# Patient Record
Sex: Female | Born: 1990 | Race: Asian | Hispanic: No | Smoking: Never smoker
Health system: Southern US, Community
[De-identification: ages and names within clinical notes are randomized; demographics above are authoritative.]

## PROBLEM LIST (undated history)

## (undated) DIAGNOSIS — Z789 Other specified health status: Secondary | ICD-10-CM

## (undated) HISTORY — DX: Other specified health status: Z78.9

---

## 2009-04-29 ENCOUNTER — Emergency Department (HOSPITAL_COMMUNITY): Admission: EM | Admit: 2009-04-29 | Discharge: 2009-04-29 | Payer: Self-pay | Admitting: Family Medicine

## 2009-05-04 ENCOUNTER — Ambulatory Visit: Payer: Self-pay | Admitting: Oncology

## 2010-07-17 IMAGING — CR DG ABDOMEN 1V
1 series · 1 of 1 positions shown · non-contrast
Comparison: None.

CLINICAL DATA: Abdominal pain

ABDOMEN - 1 VIEW

[view not recorded]
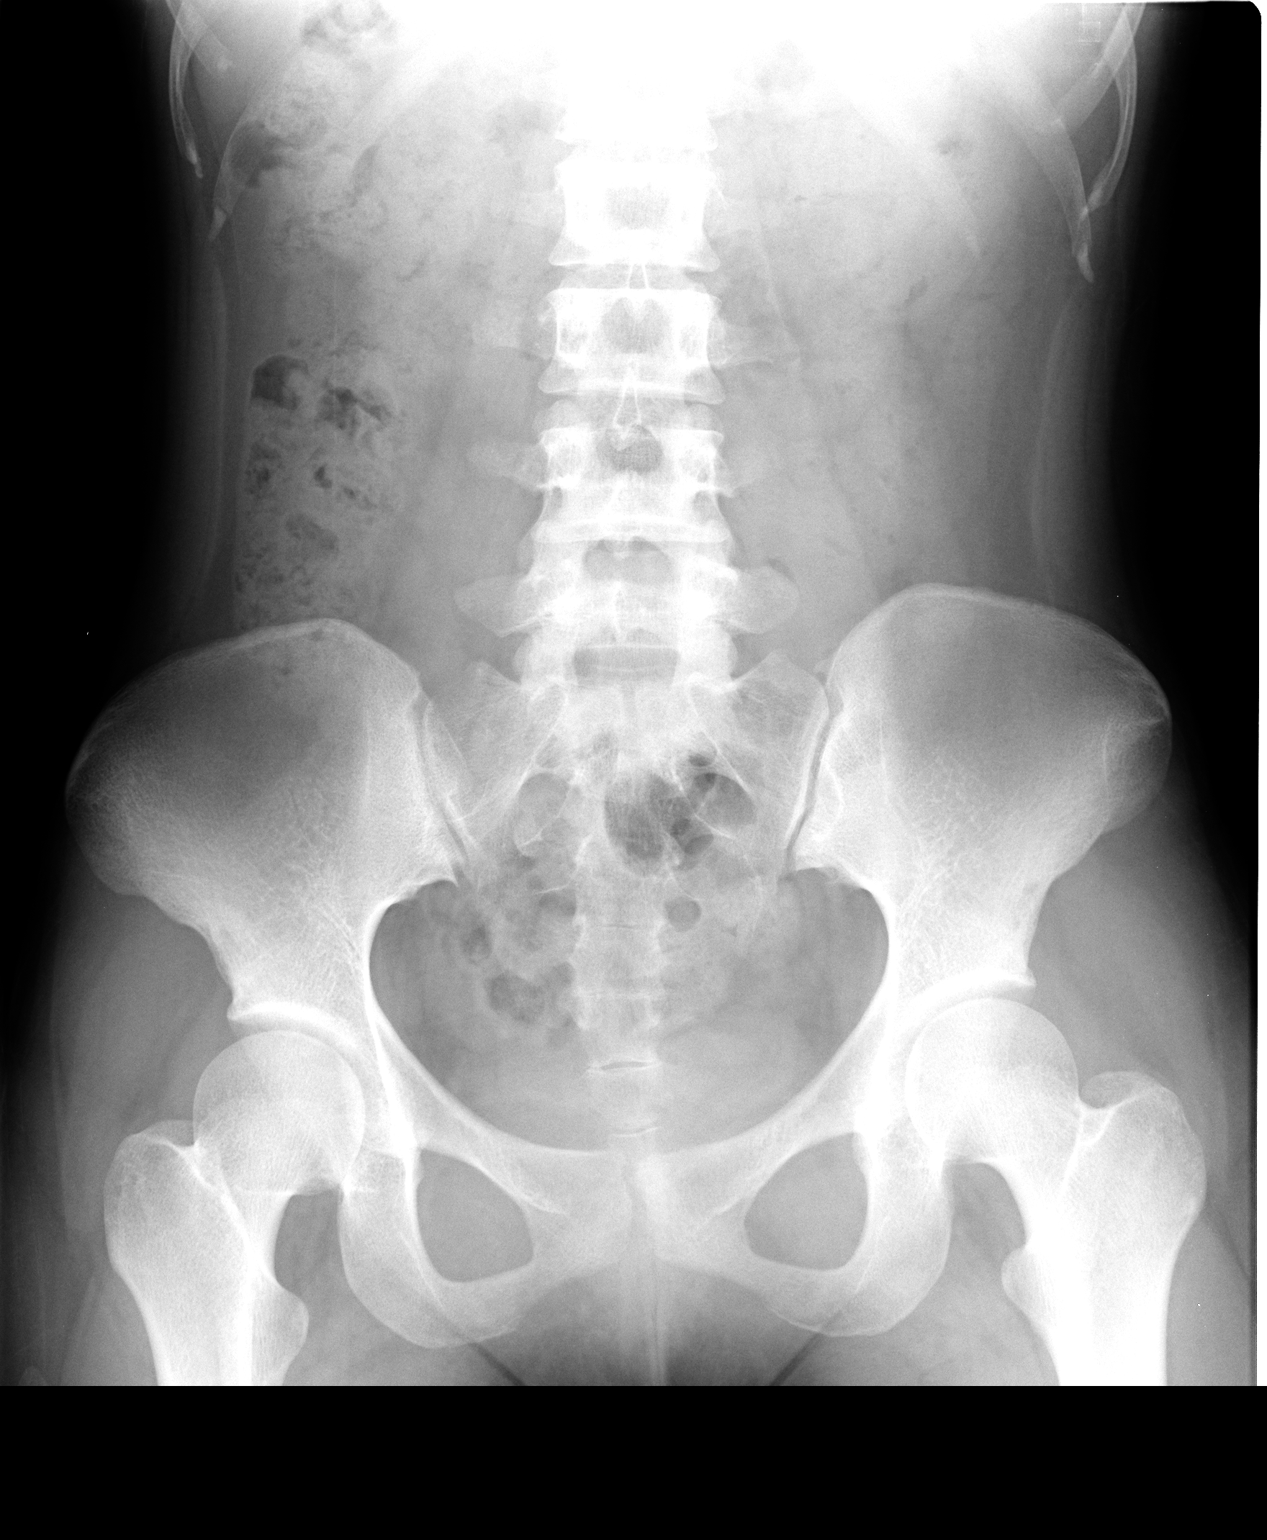

[1 of 1 positions shown; findings below may reference images not displayed]

FINDINGS: Stool is seen scattered along the length of the colon.
There is no gaseous bowel dilatation to suggest obstruction.  No
unexpected abdominopelvic calcification.  Visualized bones are
normal.
IMPRESSION: Normal exam.

## 2010-09-15 LAB — POCT I-STAT, CHEM 8
BUN: 15 mg/dL (ref 6–23)
Creatinine, Ser: 0.5 mg/dL (ref 0.4–1.2)
Potassium: 3.7 mEq/L (ref 3.5–5.1)
Sodium: 141 mEq/L (ref 135–145)

## 2010-09-15 LAB — POCT H PYLORI SCREEN: H. PYLORI SCREEN, POC: NEGATIVE

## 2010-09-15 LAB — CBC
Platelets: 496 10*3/uL — ABNORMAL HIGH (ref 150–400)
RBC: 5.18 MIL/uL — ABNORMAL HIGH (ref 3.87–5.11)
WBC: 8.5 10*3/uL (ref 4.0–10.5)

## 2010-09-15 LAB — DIFFERENTIAL
Basophils Absolute: 0 10*3/uL (ref 0.0–0.1)
Eosinophils Relative: 5 % (ref 0–5)
Lymphocytes Relative: 31 % (ref 12–46)
Monocytes Relative: 8 % (ref 3–12)
Neutrophils Relative %: 56 % (ref 43–77)

## 2010-09-15 LAB — POCT URINALYSIS DIP (DEVICE)
Bilirubin Urine: NEGATIVE
Glucose, UA: NEGATIVE mg/dL
Ketones, ur: NEGATIVE mg/dL
Nitrite: NEGATIVE
pH: 6.5 (ref 5.0–8.0)

## 2010-09-15 LAB — POCT PREGNANCY, URINE: Preg Test, Ur: NEGATIVE

## 2016-02-23 ENCOUNTER — Ambulatory Visit: Payer: Self-pay

## 2016-02-23 ENCOUNTER — Encounter (HOSPITAL_COMMUNITY): Payer: Self-pay | Admitting: Emergency Medicine

## 2016-02-23 ENCOUNTER — Emergency Department (HOSPITAL_COMMUNITY)
Admission: EM | Admit: 2016-02-23 | Discharge: 2016-02-23 | Disposition: A | Discharge status: Home / Self Care | Payer: Medicaid Other | Attending: Emergency Medicine | Admitting: Emergency Medicine

## 2016-02-23 DIAGNOSIS — M545 Low back pain, unspecified: Secondary | ICD-10-CM

## 2016-02-23 DIAGNOSIS — Y999 Unspecified external cause status: Secondary | ICD-10-CM | POA: Insufficient documentation

## 2016-02-23 DIAGNOSIS — M62830 Muscle spasm of back: Secondary | ICD-10-CM | POA: Diagnosis not present

## 2016-02-23 DIAGNOSIS — Y939 Activity, unspecified: Secondary | ICD-10-CM | POA: Insufficient documentation

## 2016-02-23 DIAGNOSIS — Y92410 Unspecified street and highway as the place of occurrence of the external cause: Secondary | ICD-10-CM | POA: Insufficient documentation

## 2016-02-23 MED ORDER — CYCLOBENZAPRINE HCL 10 MG PO TABS: 10.0000 mg | ORAL_TABLET | Freq: Three times a day (TID) | ORAL | 0 refills | Status: DC | PRN

## 2016-02-23 MED ORDER — NAPROXEN 500 MG PO TABS: 500.0000 mg | ORAL_TABLET | Freq: Two times a day (BID) | ORAL | 0 refills | Status: DC | PRN
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
## 2016-02-23 NOTE — Discharge Instructions (Signed)
Take naprosyn as directed for inflammation and pain with tylenol for breakthrough pain and flexeril for muscle relaxation. Do not drive or operate machinery with muscle relaxant use. Ice to areas of soreness for the next 24 hours and then may move to heat, no more than 20 minutes at a time every hour for each. Expect to be sore for the next few days and follow up with [facility] for recheck of ongoing symptoms and establish medical care in the next 1-2 weeks. Return to ER for emergent changing or worsening of symptoms.
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
## 2018-12-13 ENCOUNTER — Other Ambulatory Visit: Payer: Self-pay | Admitting: Internal Medicine

## 2018-12-13 ENCOUNTER — Other Ambulatory Visit: Payer: Self-pay

## 2018-12-13 DIAGNOSIS — Z20822 Contact with and (suspected) exposure to covid-19: Secondary | ICD-10-CM

## 2018-12-13 DIAGNOSIS — Z20828 Contact with and (suspected) exposure to other viral communicable diseases: Secondary | ICD-10-CM

## 2018-12-20 LAB — NOVEL CORONAVIRUS, NAA: SARS-CoV-2, NAA: NOT DETECTED

## 2019-01-28 ENCOUNTER — Other Ambulatory Visit: Payer: Self-pay

## 2019-01-28 DIAGNOSIS — Z20822 Contact with and (suspected) exposure to covid-19: Secondary | ICD-10-CM

## 2019-01-30 LAB — NOVEL CORONAVIRUS, NAA: SARS-CoV-2, NAA: NOT DETECTED

## 2022-08-26 ENCOUNTER — Other Ambulatory Visit: Payer: Self-pay | Admitting: Obstetrics and Gynecology

## 2022-08-26 ENCOUNTER — Ambulatory Visit
Admission: RE | Admit: 2022-08-26 | Discharge: 2022-08-26 | Disposition: A | Discharge status: Home / Self Care | Payer: No Typology Code available for payment source | Source: Ambulatory Visit | Attending: Obstetrics and Gynecology | Admitting: Obstetrics and Gynecology

## 2022-08-26 DIAGNOSIS — Z111 Encounter for screening for respiratory tuberculosis: Secondary | ICD-10-CM

## 2023-02-14 ENCOUNTER — Other Ambulatory Visit (HOSPITAL_COMMUNITY)
Admission: RE | Admit: 2023-02-14 | Discharge: 2023-02-14 | Disposition: A | Discharge status: Home / Self Care | Payer: Medicaid Other | Source: Ambulatory Visit | Attending: Family Medicine | Admitting: Family Medicine

## 2023-02-14 ENCOUNTER — Ambulatory Visit (INDEPENDENT_AMBULATORY_CARE_PROVIDER_SITE_OTHER): Payer: Medicaid Other

## 2023-02-14 ENCOUNTER — Other Ambulatory Visit: Payer: Self-pay

## 2023-02-14 VITALS — BP 130/84 | HR 88 | Ht 60.5 in | Wt 139.0 lb

## 2023-02-14 DIAGNOSIS — Z98891 History of uterine scar from previous surgery: Secondary | ICD-10-CM | POA: Insufficient documentation

## 2023-02-14 DIAGNOSIS — R8271 Bacteriuria: Secondary | ICD-10-CM

## 2023-02-14 DIAGNOSIS — Z3490 Encounter for supervision of normal pregnancy, unspecified, unspecified trimester: Secondary | ICD-10-CM | POA: Diagnosis present

## 2023-02-14 MED ORDER — BLOOD PRESSURE KIT DEVI: 1.0000 | Freq: Once | 0 refills | Status: AC

## 2023-02-14 MED ORDER — PRENATAL PLUS VITAMIN/MINERAL 27-1 MG PO TABS: 1.0000 | ORAL_TABLET | Freq: Every day | ORAL | 11 refills | Status: DC

## 2023-02-14 NOTE — Progress Notes (Addendum)
New OB Intake  Christine Hardy here for New OB Intake today. We discussed EDD of 05/30/2023, by unsure LMP. Pt is [name]. I reviewed her allergies, medications and Medical/Surgical/OB history.    Patient Active Problem List   Diagnosis Date Noted   Supervision of low-risk pregnancy 02/14/2023   History of C-section 02/14/2023   Concerns addressed today Prenatal vitamins sent to preferred pharmacy.  Delivery Plans Plans to deliver at [facility]. Patient reports history of c-section in 2016; pt was told this was due to baby being "face up." C-section done in [facility]. Will have patient sign ROI next visit for op note. Unable to locate record in Care Everywhere. Pt expresses she is hoping to have a vaginal birth with this pregnancy.  MyChart/Babyscripts MyChart access verified. Babyscripts instructions given and order placed.   Blood Pressure Cuff/Weight Scale Blood pressure cuff ordered for patient to pick-up from [employer]. Explained after first prenatal appt pt will check weekly and document in Babyscripts. Patient is unsure about checking BP at home. Explained she doesn't have to decide today and can use Babyscripts for pregnancy information only if she chooses. Patient does have weight scale.   Anatomy US Explained first scheduled [place] will be around 19 weeks. Scheduled 02/22/23.  Is patient a CenteringPregnancy candidate?  Declined Declined due to Group setting   Is patient a Mom+Baby Combined Care candidate?  Not a candidate    Is patient a candidate for Babyscripts Optimization? No - unsure about checking BP at home  First visit review All prenatal labs drawn today. Pt declines carrier screening but desires NIPS; Panorama drawn today. Will need Pap at first visit with provider. Will be seen by Tinnie Gens, MD.  Last Pap No results found for: "DIAGPAP"  Marjo Bicker, RN 02/14/2023  9:14 AM

## 2023-02-15 LAB — CBC/D/PLT+RPR+RH+ABO+RUBIGG...
Antibody Screen: NEGATIVE
Basophils Absolute: 0.1 10*3/uL (ref 0.0–0.2)
Basos: 1 %
EOS (ABSOLUTE): 0.1 10*3/uL (ref 0.0–0.4)
Eos: 1 %
HCV Ab: NONREACTIVE
HIV Screen 4th Generation wRfx: NONREACTIVE
Hematocrit: 35.4 % (ref 34.0–46.6)
Hemoglobin: 11.6 g/dL (ref 11.1–15.9)
Hepatitis B Surface Ag: NEGATIVE
Immature Grans (Abs): 0.1 10*3/uL (ref 0.0–0.1)
Immature Granulocytes: 1 %
Lymphocytes Absolute: 2 10*3/uL (ref 0.7–3.1)
Lymphs: 19 %
MCH: 27.4 pg (ref 26.6–33.0)
MCHC: 32.8 g/dL (ref 31.5–35.7)
MCV: 84 fL (ref 79–97)
Monocytes Absolute: 0.8 10*3/uL (ref 0.1–0.9)
Monocytes: 7 %
Neutrophils Absolute: 7.7 10*3/uL — ABNORMAL HIGH (ref 1.4–7.0)
Neutrophils: 71 %
Platelets: 288 10*3/uL (ref 150–450)
RBC: 4.23 x10E6/uL (ref 3.77–5.28)
RDW: 13.1 % (ref 11.7–15.4)
RPR Ser Ql: NONREACTIVE
Rh Factor: POSITIVE
Rubella Antibodies, IGG: 5.03 {index} (ref >0.99)
WBC: 10.7 10*3/uL (ref 3.4–10.8)

## 2023-02-15 LAB — HCV INTERPRETATION

## 2023-02-15 LAB — HEMOGLOBIN A1C
Est. average glucose Bld gHb Est-mCnc: 97 mg/dL
Hgb A1c MFr Bld: 5 % (ref 4.8–5.6)

## 2023-02-16 LAB — GC/CHLAMYDIA PROBE AMP (~~LOC~~) NOT AT ARMC
Chlamydia: NEGATIVE
Comment: NEGATIVE
Comment: NORMAL
Neisseria Gonorrhea: NEGATIVE

## 2023-02-17 ENCOUNTER — Other Ambulatory Visit (HOSPITAL_COMMUNITY)
Admission: RE | Admit: 2023-02-17 | Discharge: 2023-02-17 | Disposition: A | Discharge status: Home / Self Care | Payer: Medicaid Other | Source: Ambulatory Visit | Attending: Family Medicine | Admitting: Family Medicine

## 2023-02-17 ENCOUNTER — Ambulatory Visit (INDEPENDENT_AMBULATORY_CARE_PROVIDER_SITE_OTHER): Payer: Medicaid Other | Admitting: Family Medicine

## 2023-02-17 ENCOUNTER — Other Ambulatory Visit: Payer: Self-pay

## 2023-02-17 VITALS — BP 131/86 | HR 94 | Wt 139.9 lb

## 2023-02-17 DIAGNOSIS — Z98891 History of uterine scar from previous surgery: Secondary | ICD-10-CM

## 2023-02-17 DIAGNOSIS — Z3A25 25 weeks gestation of pregnancy: Secondary | ICD-10-CM

## 2023-02-17 DIAGNOSIS — Z3492 Encounter for supervision of normal pregnancy, unspecified, second trimester: Secondary | ICD-10-CM

## 2023-02-17 NOTE — Progress Notes (Signed)
Subjective:   Christine Hardy is a 32 y.o. [name] at [redacted]w[redacted]d by unsure LMP being seen today for her first obstetrical visit.  Her obstetrical history is significant for  previous C-section . Patient does intend to breast feed. Pregnancy history fully reviewed.  Patient reports no complaints.  HISTORY: OB History  Gravida Para Term Preterm AB Living  2 1 1  0 0 1  SAB IAB Ectopic Multiple Live Births  0 0 0 0 1    # Outcome Date GA Lbr Len/2nd Weight Sex Type Anes PTL Lv  2 Current           1 Term 08/04/14 [redacted]w[redacted]d  6 lb 3 oz (2.807 kg) F CS-LTranv   LIV   No past medical history on file. Past Surgical History:  Procedure Laterality Date   CESAREAN SECTION     Family History  Problem Relation Age of Onset   Diabetes Mother    Hypertension Mother    Diabetes Father    Social History   Tobacco Use   Smoking status: Never   Smokeless tobacco: Never  Vaping Use   Vaping status: Never Used  Substance Use Topics   Alcohol use: Never   Drug use: Never   No Known Allergies Current Outpatient Medications on File Prior to Visit  Medication Sig Dispense Refill   Prenatal Vit-Fe Fumarate-FA (PRENATAL PLUS VITAMIN/MINERAL) 27-1 MG TABS Take 1 tablet by mouth daily. 30 tablet 11   No current facility-administered medications on file prior to visit.     Exam   Vitals:   02/17/23 0949  BP: 131/86  Pulse: 94  Weight: 139 lb 14.4 oz (63.5 kg)   Fetal Heart Rate (bpm): 150  Uterus:  Fundal Height: 23 cm  Pelvic Exam: Perineum: no hemorrhoids, normal perineum   Vulva: normal external genitalia, no lesions   Vagina:  normal mucosa, normal discharge   Cervix: no lesions and normal, pap smear done.    Adnexa: normal adnexa and no mass, fullness, tenderness   Bony Pelvis: average  System: General: well-developed, well-nourished female in no acute distress   Breast:  normal appearance, no masses or tenderness   Skin: normal coloration and turgor, no rashes   Neurologic:  oriented, normal, negative, normal mood   Extremities: normal strength, tone, and muscle mass, ROM of all joints is normal   HEENT PERRLA, extraocular movement intact and sclera clear, anicteric   Mouth/Teeth mucous membranes moist, pharynx normal without lesions and dental hygiene good   Neck supple and no masses   Cardiovascular: regular rate and rhythm   Respiratory:  no respiratory distress, normal breath sounds   Abdomen: soft, non-tender; bowel sounds normal; no masses,  no organomegaly     Assessment:   Pregnancy: [name] Patient Active Problem List   Diagnosis Date Noted   Supervision of low-risk pregnancy 02/14/2023   History of C-section 02/14/2023     Plan:  1. History of C-section Due to OP presentation. Discussed F/B of VBAC vs. RCS--she will consider--needs consent after she makes a decision  2. Encounter for supervision of low-risk pregnancy in second trimester New OB labs are WNL She is unsure of her LMP, has been feeling fetal movement for several weeks.  Initial labs reviewed. Continue prenatal vitamins. Genetic Screening discussed, NIPS: ordered. Ultrasound discussed; fetal anatomic survey: ordered. Problem list reviewed and updated.  Routine obstetric precautions reviewed. Return in 3 weeks (on 03/10/2023) for 28 wk labs, [facility].

## 2023-02-17 NOTE — Addendum Note (Signed)
Addended by: Maxwell Marion E on: 02/17/2023 11:29 AM   Modules accepted: Orders

## 2023-02-18 LAB — CULTURE, OB URINE

## 2023-02-18 LAB — URINE CULTURE, OB REFLEX

## 2023-02-19 ENCOUNTER — Encounter: Payer: Self-pay | Admitting: Family Medicine

## 2023-02-19 DIAGNOSIS — R8271 Bacteriuria: Secondary | ICD-10-CM | POA: Insufficient documentation

## 2023-02-19 MED ORDER — CEFADROXIL 500 MG PO CAPS: 500.0000 mg | ORAL_CAPSULE | Freq: Two times a day (BID) | ORAL | 0 refills | Status: AC

## 2023-02-19 NOTE — Addendum Note (Signed)
Addended by: Reva Bores on: 02/19/2023 10:20 AM   Modules accepted: Orders

## 2023-02-21 LAB — CYTOLOGY - PAP
Comment: NEGATIVE
Diagnosis: NEGATIVE
High risk HPV: NEGATIVE

## 2023-02-22 ENCOUNTER — Other Ambulatory Visit: Payer: Medicaid Other

## 2023-02-22 ENCOUNTER — Ambulatory Visit: Payer: Medicaid Other

## 2023-02-22 LAB — PANORAMA PRENATAL TEST FULL PANEL:PANORAMA TEST PLUS 5 ADDITIONAL MICRODELETIONS: FETAL FRACTION: 7.4

## 2023-03-02 ENCOUNTER — Encounter: Payer: Self-pay | Admitting: *Deleted

## 2023-03-02 ENCOUNTER — Other Ambulatory Visit: Payer: Self-pay | Admitting: *Deleted

## 2023-03-02 ENCOUNTER — Ambulatory Visit: Payer: Medicaid Other | Attending: Family Medicine | Admitting: *Deleted

## 2023-03-02 ENCOUNTER — Ambulatory Visit (HOSPITAL_BASED_OUTPATIENT_CLINIC_OR_DEPARTMENT_OTHER): Payer: Medicaid Other

## 2023-03-02 VITALS — BP 138/76 | HR 101

## 2023-03-02 DIAGNOSIS — Z98891 History of uterine scar from previous surgery: Secondary | ICD-10-CM | POA: Diagnosis present

## 2023-03-02 DIAGNOSIS — Z363 Encounter for antenatal screening for malformations: Secondary | ICD-10-CM | POA: Insufficient documentation

## 2023-03-02 DIAGNOSIS — Z362 Encounter for other antenatal screening follow-up: Secondary | ICD-10-CM

## 2023-03-02 DIAGNOSIS — O34219 Maternal care for unspecified type scar from previous cesarean delivery: Secondary | ICD-10-CM | POA: Diagnosis not present

## 2023-03-02 DIAGNOSIS — O2692 Pregnancy related conditions, unspecified, second trimester: Secondary | ICD-10-CM | POA: Insufficient documentation

## 2023-03-02 DIAGNOSIS — Z3492 Encounter for supervision of normal pregnancy, unspecified, second trimester: Secondary | ICD-10-CM

## 2023-03-02 DIAGNOSIS — Z3A21 21 weeks gestation of pregnancy: Secondary | ICD-10-CM | POA: Diagnosis not present

## 2023-03-02 DIAGNOSIS — O358XX0 Maternal care for other (suspected) fetal abnormality and damage, not applicable or unspecified: Secondary | ICD-10-CM | POA: Insufficient documentation

## 2023-03-02 DIAGNOSIS — Z3490 Encounter for supervision of normal pregnancy, unspecified, unspecified trimester: Secondary | ICD-10-CM

## 2023-03-09 ENCOUNTER — Other Ambulatory Visit: Payer: Self-pay

## 2023-03-09 ENCOUNTER — Encounter: Payer: Self-pay | Admitting: Obstetrics and Gynecology

## 2023-03-09 ENCOUNTER — Ambulatory Visit (INDEPENDENT_AMBULATORY_CARE_PROVIDER_SITE_OTHER): Payer: Medicaid Other | Admitting: Obstetrics and Gynecology

## 2023-03-09 ENCOUNTER — Other Ambulatory Visit: Payer: Medicaid Other

## 2023-03-09 VITALS — BP 130/81 | HR 88 | Wt 142.8 lb

## 2023-03-09 DIAGNOSIS — O99891 Other specified diseases and conditions complicating pregnancy: Secondary | ICD-10-CM

## 2023-03-09 DIAGNOSIS — Z3A22 22 weeks gestation of pregnancy: Secondary | ICD-10-CM

## 2023-03-09 DIAGNOSIS — R8271 Bacteriuria: Secondary | ICD-10-CM

## 2023-03-09 DIAGNOSIS — Z3492 Encounter for supervision of normal pregnancy, unspecified, second trimester: Secondary | ICD-10-CM

## 2023-03-09 DIAGNOSIS — Z98891 History of uterine scar from previous surgery: Secondary | ICD-10-CM

## 2023-03-09 NOTE — Progress Notes (Signed)
Pt reports cramping in leg

## 2023-03-09 NOTE — Progress Notes (Signed)
Subjective:  Christine Hardy is a 32 y.o. [name] at [redacted]w[redacted]d being seen today for ongoing prenatal care.  She is currently monitored for the following issues for this low-risk pregnancy and has Supervision of low-risk pregnancy; History of C-section; and Asymptomatic bacteriuria during pregnancy on their problem list.  Patient reports  night leg cramps .  Contractions: Not present. Vag. Bleeding: None.  Movement: Present. Denies leaking of fluid.   The following portions of the patient's history were reviewed and updated as appropriate: allergies, current medications, past family history, past medical history, past social history, past surgical history and problem list. Problem list updated.  Objective:   Vitals:   03/09/23 0830  BP: 130/81  Pulse: 88  Weight: 142 lb 12.8 oz (64.8 kg)    Fetal Status: Fetal Heart Rate (bpm): 148   Movement: Present     General:  Alert, oriented and cooperative. Patient is in no acute distress.  Skin: Skin is warm and dry. No rash noted.   Cardiovascular: Normal heart rate noted  Respiratory: Normal respiratory effort, no problems with respiration noted  Abdomen: Soft, gravid, appropriate for gestational age. Pain/Pressure: Absent     Pelvic:  Cervical exam deferred        Extremities: Normal range of motion.  Edema: Trace  Mental Status: Normal mood and affect. Normal behavior. Normal judgment and thought content.   Urinalysis:      Assessment and Plan:  Pregnancy: [name] at [redacted]w[redacted]d  1. Encounter for supervision of low-risk pregnancy in second trimester Stable Declined Flu vaccine Glucola next visit, reviewed with pt  2. History of C-section VBAC consent signed today  3. Asymptomatic bacteriuria during pregnancy UC today  Preterm labor symptoms and general obstetric precautions including but not limited to vaginal bleeding, contractions, leaking of fluid and fetal movement were reviewed in detail with the patient. Please refer to After Visit Summary  for other counseling recommendations.  Return in about 4 weeks (around 04/06/2023) for OB visit, face to face, any provider, fasting for Glucola.   Hermina Staggers, MD

## 2023-03-30 ENCOUNTER — Ambulatory Visit: Payer: Medicaid Other | Attending: Obstetrics

## 2023-03-30 DIAGNOSIS — Z362 Encounter for other antenatal screening follow-up: Secondary | ICD-10-CM | POA: Diagnosis present

## 2023-03-30 DIAGNOSIS — Z3492 Encounter for supervision of normal pregnancy, unspecified, second trimester: Secondary | ICD-10-CM | POA: Insufficient documentation

## 2023-03-30 DIAGNOSIS — O99891 Other specified diseases and conditions complicating pregnancy: Secondary | ICD-10-CM | POA: Diagnosis not present

## 2023-03-30 DIAGNOSIS — Z3A25 25 weeks gestation of pregnancy: Secondary | ICD-10-CM | POA: Insufficient documentation

## 2023-03-30 DIAGNOSIS — O34219 Maternal care for unspecified type scar from previous cesarean delivery: Secondary | ICD-10-CM | POA: Diagnosis not present

## 2023-03-30 DIAGNOSIS — R8271 Bacteriuria: Secondary | ICD-10-CM | POA: Diagnosis not present

## 2023-04-05 ENCOUNTER — Other Ambulatory Visit: Payer: Self-pay

## 2023-04-05 DIAGNOSIS — Z3492 Encounter for supervision of normal pregnancy, unspecified, second trimester: Secondary | ICD-10-CM

## 2023-04-06 ENCOUNTER — Ambulatory Visit (INDEPENDENT_AMBULATORY_CARE_PROVIDER_SITE_OTHER): Payer: Medicaid Other | Admitting: Obstetrics and Gynecology

## 2023-04-06 ENCOUNTER — Other Ambulatory Visit: Payer: Self-pay

## 2023-04-06 ENCOUNTER — Other Ambulatory Visit: Payer: Medicaid Other

## 2023-04-06 VITALS — BP 138/82 | HR 108 | Wt 148.0 lb

## 2023-04-06 DIAGNOSIS — Z3492 Encounter for supervision of normal pregnancy, unspecified, second trimester: Secondary | ICD-10-CM

## 2023-04-06 DIAGNOSIS — Z3A26 26 weeks gestation of pregnancy: Secondary | ICD-10-CM

## 2023-04-06 NOTE — Progress Notes (Addendum)
Pt reports that she fell Monday morning, but didn't fall on stomach just hands & scraped knees.

## 2023-04-06 NOTE — Progress Notes (Signed)
   PRENATAL VISIT NOTE  Subjective:  Christine Hardy is a 32 y.o. [name] at [redacted]w[redacted]d being seen today for ongoing prenatal care.  She is currently monitored for the following issues for this low-risk pregnancy and has Supervision of low-risk pregnancy; History of C-section; and Asymptomatic bacteriuria during pregnancy on their problem list.  Patient reports no complaints. On Monday pt experienced a fall on her hands and knees but spared her abdomen. Pt did not go to the MAU after, for evaluation, but she endorses fetal movement and denies any cramping, vaginal bleeding, or pelvic pain. Contractions: Not present. Vag. Bleeding: None.  Movement: Present. Denies leaking of fluid.   The following portions of the patient's history were reviewed and updated as appropriate: allergies, current medications, past family history, past medical history, past social history, past surgical history and problem list.   Objective:   Vitals:   04/06/23 0842  BP: 138/82  Pulse: (!) 108  Weight: 148 lb (67.1 kg)    Fetal Status: Fetal Heart Rate (bpm): 156 Fundal Height: 26 cm Movement: Present     General:  Alert, oriented and cooperative. Patient is in no acute distress.  Skin: Skin is warm and dry. No rash noted.   Cardiovascular: Normal heart rate noted  Respiratory: Normal respiratory effort, no problems with respiration noted  Abdomen: Soft, gravid, appropriate for gestational age.  Pain/Pressure: Absent     Pelvic: Cervical exam deferred        Extremities: Normal range of motion.  Edema: Trace  Mental Status: Normal mood and affect. Normal behavior. Normal judgment and thought content.   Assessment and Plan:  Pregnancy: [name] at [redacted]w[redacted]d 1. Encounter for supervision of low-risk pregnancy in second trimester  - Glucose tolerance, 1 hour  Preterm labor symptoms and general obstetric precautions including but not limited to vaginal bleeding, contractions, leaking of fluid and fetal movement were reviewed  in detail with the patient. Please refer to After Visit Summary for other counseling recommendations.   Return in about 2 weeks (around 04/20/2023) for OB VISIT (MD or APP).  No future appointments.  Windy Fast Hakeen Shipes, Student-PA

## 2023-04-07 LAB — CBC
Hematocrit: 35.7 % (ref 34.0–46.6)
Hemoglobin: 11.1 g/dL (ref 11.1–15.9)
MCH: 26.6 pg (ref 26.6–33.0)
MCHC: 31.1 g/dL — ABNORMAL LOW (ref 31.5–35.7)
MCV: 85 fL (ref 79–97)
Platelets: 314 10*3/uL (ref 150–450)
RBC: 4.18 x10E6/uL (ref 3.77–5.28)
RDW: 12.7 % (ref 11.7–15.4)
WBC: 11 10*3/uL — ABNORMAL HIGH (ref 3.4–10.8)

## 2023-04-07 LAB — RPR: RPR Ser Ql: NONREACTIVE

## 2023-04-07 LAB — GLUCOSE TOLERANCE, 1 HOUR: Glucose, 1Hr PP: 184 mg/dL (ref 70–199)

## 2023-04-07 LAB — HIV ANTIBODY (ROUTINE TESTING W REFLEX): HIV Screen 4th Generation wRfx: NONREACTIVE

## 2023-04-24 ENCOUNTER — Ambulatory Visit (INDEPENDENT_AMBULATORY_CARE_PROVIDER_SITE_OTHER): Payer: Medicaid Other | Admitting: Obstetrics and Gynecology

## 2023-04-24 ENCOUNTER — Other Ambulatory Visit: Payer: Self-pay

## 2023-04-24 VITALS — BP 115/80 | HR 84 | Wt 148.0 lb

## 2023-04-24 DIAGNOSIS — O99891 Other specified diseases and conditions complicating pregnancy: Secondary | ICD-10-CM

## 2023-04-24 DIAGNOSIS — Z98891 History of uterine scar from previous surgery: Secondary | ICD-10-CM

## 2023-04-24 DIAGNOSIS — R8271 Bacteriuria: Secondary | ICD-10-CM

## 2023-04-24 DIAGNOSIS — Z3A29 29 weeks gestation of pregnancy: Secondary | ICD-10-CM

## 2023-04-24 DIAGNOSIS — Z3492 Encounter for supervision of normal pregnancy, unspecified, second trimester: Secondary | ICD-10-CM

## 2023-04-24 MED ORDER — CEFADROXIL 500 MG PO CAPS: 500.0000 mg | ORAL_CAPSULE | Freq: Two times a day (BID) | ORAL | 0 refills | Status: DC

## 2023-04-24 NOTE — Progress Notes (Signed)
   PRENATAL VISIT NOTE  Subjective:  Christine Hardy is a 32 y.o. [name] at [redacted]w[redacted]d being seen today for ongoing prenatal care.  She is currently monitored for the following issues for this low-risk pregnancy and has Supervision of low-risk pregnancy; History of C-section; and Asymptomatic bacteriuria during pregnancy on their problem list.  Patient reports no complaints.  Contractions: Not present. Vag. Bleeding: None.  Movement: Present. Denies leaking of fluid.   The following portions of the patient's history were reviewed and updated as appropriate: allergies, current medications, past family history, past medical history, past social history, past surgical history and problem list.   Objective:   Vitals:   04/24/23 0820 04/24/23 0838  BP: (!) 142/83 115/80  Pulse: 94 84  Weight: 148 lb (67.1 kg)     Fetal Status: Fetal Heart Rate (bpm): 147   Movement: Present     General:  Alert, oriented and cooperative. Patient is in no acute distress.  Skin: Skin is warm and dry. No rash noted.   Cardiovascular: Normal heart rate noted  Respiratory: Normal respiratory effort, no problems with respiration noted  Abdomen: Soft, gravid, appropriate for gestational age.  Pain/Pressure: Absent     Pelvic: Cervical exam deferred        Extremities: Normal range of motion.  Edema: Trace  Mental Status: Normal mood and affect. Normal behavior. Normal judgment and thought content.   Assessment and Plan:  Pregnancy: [name] at [redacted]w[redacted]d 1. Encounter for supervision of low-risk pregnancy in second trimester BP normal on repeat, FHR normal Doing well, feeling regular movement    2. History of C-section VBAC consent signed 9/26  3. Asymptomatic bacteriuria during pregnancy Never took abx, resent and rechecking today   - cefadroxil (DURICEF) 500 MG capsule; Take 1 capsule (500 mg total) by mouth 2 (two) times daily.  Dispense: 14 capsule; Refill: 0  4. [redacted] weeks gestation of pregnancy Discussed methods  of contraception today, going to think about it   Preterm labor symptoms and general obstetric precautions including but not limited to vaginal bleeding, contractions, leaking of fluid and fetal movement were reviewed in detail with the patient. Please refer to After Visit Summary for other counseling recommendations.   Return in 2 weeks for routine prenatal   Albertine Grates, FNP

## 2023-04-24 NOTE — Addendum Note (Signed)
Addended byQuintella Reichert on: 04/24/2023 09:13 AM   Modules accepted: Orders

## 2023-05-08 ENCOUNTER — Other Ambulatory Visit: Payer: Self-pay

## 2023-05-08 ENCOUNTER — Ambulatory Visit: Payer: Medicaid Other | Admitting: Obstetrics & Gynecology

## 2023-05-08 VITALS — BP 124/85 | HR 96 | Wt 147.8 lb

## 2023-05-08 DIAGNOSIS — O99893 Other specified diseases and conditions complicating puerperium: Secondary | ICD-10-CM

## 2023-05-08 DIAGNOSIS — Z98891 History of uterine scar from previous surgery: Secondary | ICD-10-CM

## 2023-05-08 DIAGNOSIS — Z3A31 31 weeks gestation of pregnancy: Secondary | ICD-10-CM

## 2023-05-08 DIAGNOSIS — Z3492 Encounter for supervision of normal pregnancy, unspecified, second trimester: Secondary | ICD-10-CM

## 2023-05-08 DIAGNOSIS — O34219 Maternal care for unspecified type scar from previous cesarean delivery: Secondary | ICD-10-CM

## 2023-05-08 DIAGNOSIS — R8271 Bacteriuria: Secondary | ICD-10-CM

## 2023-05-08 NOTE — Patient Instructions (Signed)

## 2023-05-08 NOTE — Progress Notes (Signed)
Subjective:  Christine Hardy is a 32 y.o. [name] at [redacted]w[redacted]d being seen today for prenatal care. Patient states she has noticed a runny nose, sore throat and cough the past week. Her daughter had similar symptoms a couple weeks ago. Otherwise, patient reports no complaints. Her daughter had similar symptoms a couple weeks ago. Contractions: Not present.  Vag. Bleeding: None. Movement: Present. Denies leaking of fluid.   The following portions of the patient's history were reviewed and updated as appropriate: allergies, current medications, past family history, past medical history, past social history, past surgical history and problem list.   Objective:   Vitals:   05/08/23 1441  BP: 124/85  Pulse: 96  Weight: 147 lb 12.8 oz (67 kg)    Fetal Status: Fetal Heart Rate (bpm): 150   Movement: Present     General:  Alert, oriented and cooperative. Patient is in no acute distress.  Skin: Skin is warm and dry. No rash noted.   Cardiovascular: Normal heart rate noted  Respiratory: Normal respiratory effort, no problems with respiration noted  Abdomen: Soft, gravid, appropriate for gestational age. Pain/Pressure: Present     Vaginal: Vag. Bleeding: None.       Cervix: Not evaluated        Extremities: Normal range of motion.  Edema: None  Mental Status: Normal mood and affect. Normal behavior. Normal judgment and thought content.   Urinalysis:      Assessment and Plan:  Pregnancy: [name] at [redacted]w[redacted]d  1. Encounter for supervision of low-risk pregnancy in second trimester - upon reviewing her labs, patient actually failed her 1hr GTT so will need to complete a 3hr GTT.   2. History of C-section   3. Asymptomatic bacteriuria during pregnancy - Patient states she is still taking abx as she missed a couple of doses. Will follow up with TOC.   Preterm labor symptoms and general obstetric precautions including but not limited to vaginal bleeding, contractions, leaking of fluid and fetal movement were  reviewed in detail with the patient. Please refer to After Visit Summary for other counseling recommendations.  Return in about 1 week (around 05/15/2023). 3 hr GTT  Adam Phenix, MD

## 2023-05-15 NOTE — Patient Instructions (Signed)
Respiratory Syncytial Virus (RSV) Vaccine VIS  Why get vaccinated? RSV vaccine can prevent lower respiratory tract disease caused by respiratory syncytial virus (RSV). RSV is a common respiratory virus that usually causes mild, cold-like symptoms.  RSV can cause illness in people of all ages but may be especially serious for infants and older adults.  Infants up to 83 months of age (especially those 6 months and younger) and children who were born prematurely, or who have chronic lung or heart disease or a weakened immune system, are at increased risk of severe RSV disease. Adults at highest risk for severe RSV disease include older adults, adults with chronic medical conditions such as heart or lung disease, weakened immune systems, or certain other underlying medical conditions, or who live in nursing homes or long-term care facilities. RSV spreads through direct contact with the virus, such as droplets from another person's cough or sneeze contacting your eyes, nose, or mouth. It can also be spread by touching a surface that has the virus on it, like a doorknob, and then touching your face before washing your hands.  Symptoms of RSV infection may include runny nose, decrease in appetite, coughing, sneezing, fever, or wheezing. In very young infants, symptoms of RSV may also include irritability (fussiness), decreased activity, or apnea (pauses in breathing for more than 10 seconds).  Most people recover in a week or two, but RSV can be serious, resulting in shortness of breath and low oxygen levels. RSV can cause bronchiolitis (inflammation of the small airways in the lung) and pneumonia (infection of the lungs). RSV can sometimes lead to worsening of other medical conditions such as asthma, chronic obstructive pulmonary disease (a chronic disease of the lungs that makes it hard to breathe), or congestive heart failure (when the heart can't pump enough blood and oxygen throughout the body).  Older  adults and infants who get very sick from RSV may need to be hospitalized. Some may even die.  RSV vaccine CDC recommends adults 54 years of age and older have the option to receive a single dose of RSV vaccine, based on discussions between the patient and their health care provider.  There are two options for protection of infants against RSV: maternal vaccine for the pregnant person and preventive antibodies given to the baby. Only one of these options is needed for most babies to be protected. CDC recommends a single dose of RSV vaccine for pregnant people from week 32 through week 36 of pregnancy for the prevention of RSV disease in infants under 58 months of age. This vaccine is recommended to be given from September through January for most of the [place].  However, in some locations (the territories, [place], [place], and parts of [place]), the timing of vaccination may vary as RSV circulating in these locations differs from the timing of the RSV season in the rest of the [place]   RSV vaccine may be given at the same time as other vaccines.  Talk with your health care provider Tell your vaccination provider if the person getting the vaccine:  Has had an allergic reaction after a previous dose of RSV vaccine, or has any severe, life-threatening allergies In some cases, your health care provider may decide to postpone RSV vaccination until a future visit.  People with minor illnesses, such as a cold, may be vaccinated. People who are moderately or severely ill should usually wait until they recover before getting RSV vaccine.  Your health care provider can give you more  information.  Risks of a vaccine reaction Pain, redness, and swelling where the shot is given, fatigue (feeling tired), fever, headache, nausea, diarrhea, and muscle or joint pain can happen after RSV vaccination. Serious neurologic conditions, including Guillain-Barr syndrome (GBS), have been reported after RSV  vaccination in clinical trials of older adults. It is unclear whether the vaccine caused these events.  Preterm birth and high blood pressure during pregnancy, including pre-eclampsia, have been reported among pregnant people who received RSV vaccine during clinical trials. It is unclear whether these events were caused by the vaccine.  People sometimes faint after medical procedures, including vaccination. Tell your provider if you feel dizzy or have vision changes or ringing in the ears.  As with any medicine, there is a very remote chance of a vaccine causing a severe allergic reaction, other serious injury, or death.  What if there is a serious problem? An allergic reaction could occur after the vaccinated person leaves the clinic. If you see signs of a severe allergic reaction (hives, swelling of the face and throat, difficulty breathing, a fast heartbeat, dizziness, or weakness), call 9-1-1 and get the person to the nearest hospital.  For other signs that concern you, call your health care provider.  Adverse reactions should be reported to the Vaccine Adverse Event Reporting System (VAERS). Your health care provider will usually file this report, or you can do it yourself. Visit the VAERS website or call (404)812-8513. VAERS is only for reporting reactions, and VAERS staff members do not give medical advice.  How can I learn more? Ask your health care provider. Call your local or state health department. Visit the website of the Food and Drug Administration (FDA) for vaccine package inserts and additional information. Contact the Centers for Disease Control and Prevention (CDC): Call 9850116283 (1-800-CDC-INFO) or Visit CDC's vaccine website

## 2023-05-16 ENCOUNTER — Ambulatory Visit (INDEPENDENT_AMBULATORY_CARE_PROVIDER_SITE_OTHER): Payer: Medicaid Other | Admitting: Advanced Practice Midwife

## 2023-05-16 ENCOUNTER — Other Ambulatory Visit: Payer: Medicaid Other

## 2023-05-16 ENCOUNTER — Other Ambulatory Visit: Payer: Self-pay

## 2023-05-16 VITALS — BP 133/82 | HR 106 | Wt 150.9 lb

## 2023-05-16 DIAGNOSIS — Z3A32 32 weeks gestation of pregnancy: Secondary | ICD-10-CM

## 2023-05-16 DIAGNOSIS — O34219 Maternal care for unspecified type scar from previous cesarean delivery: Secondary | ICD-10-CM

## 2023-05-16 DIAGNOSIS — Z3493 Encounter for supervision of normal pregnancy, unspecified, third trimester: Secondary | ICD-10-CM

## 2023-05-16 NOTE — Progress Notes (Signed)
   PRENATAL VISIT NOTE  Subjective:  Christine Hardy is a 32 y.o. [name] at [redacted]w[redacted]d being seen today for ongoing prenatal care.  She is currently monitored for the following issues for this low-risk pregnancy and has Supervision of low-risk pregnancy; History of C-section; and Asymptomatic bacteriuria during pregnancy on their problem list.  Patient reports no complaints.  Contractions: Not present. Vag. Bleeding: None.  Movement: Present. Denies leaking of fluid.   The following portions of the patient's history were reviewed and updated as appropriate: allergies, current medications, past family history, past medical history, past social history, past surgical history and problem list.   Objective:   Vitals:   05/16/23 0845  BP: 133/82  Pulse: (!) 106  Weight: 150 lb 14.4 oz (68.4 kg)    Fetal Status: Fetal Heart Rate (bpm): 131 Fundal Height: 33 cm Movement: Present  Presentation: Vertex  General:  Alert, oriented and cooperative. Patient is in no acute distress.  Skin: Skin is warm and dry. No rash noted.   Cardiovascular: Normal heart rate noted  Respiratory: Normal respiratory effort, no problems with respiration noted  Abdomen: Soft, gravid, appropriate for gestational age.  Pain/Pressure: Present     Pelvic: Cervical exam deferred        Extremities: Normal range of motion.  Edema: Trace  Mental Status: Normal mood and affect. Normal behavior. Normal judgment and thought content.   Assessment and Plan:  Pregnancy: [name] at [redacted]w[redacted]d 1. Encounter for supervision of low-risk pregnancy in third trimester - GTT today   2. [redacted] weeks gestation of pregnancy   Preterm labor symptoms and general obstetric precautions including but not limited to vaginal bleeding, contractions, leaking of fluid and fetal movement were reviewed in detail with the patient. Please refer to After Visit Summary for other counseling recommendations.   No follow-ups on file.  Future Appointments  Date Time  Provider Department Center  05/30/2023 11:15 AM Dorathy Kinsman, CNM [facility]    [place], [place]

## 2023-05-17 LAB — GESTATIONAL GLUCOSE TOLERANCE
Glucose, Fasting: 70 mg/dL (ref 70–94)
Glucose, GTT - 1 Hour: 169 mg/dL (ref 70–179)
Glucose, GTT - 2 Hour: 149 mg/dL (ref 70–154)
Glucose, GTT - 3 Hour: 138 mg/dL (ref 70–139)

## 2023-05-30 ENCOUNTER — Other Ambulatory Visit: Payer: Self-pay

## 2023-05-30 ENCOUNTER — Ambulatory Visit (INDEPENDENT_AMBULATORY_CARE_PROVIDER_SITE_OTHER): Payer: Medicaid Other | Admitting: Advanced Practice Midwife

## 2023-05-30 VITALS — BP 118/85 | HR 87 | Wt 153.9 lb

## 2023-05-30 DIAGNOSIS — O34219 Maternal care for unspecified type scar from previous cesarean delivery: Secondary | ICD-10-CM

## 2023-05-30 DIAGNOSIS — O99891 Other specified diseases and conditions complicating pregnancy: Secondary | ICD-10-CM

## 2023-05-30 DIAGNOSIS — O99820 Streptococcus B carrier state complicating pregnancy: Secondary | ICD-10-CM

## 2023-05-30 DIAGNOSIS — Z3A34 34 weeks gestation of pregnancy: Secondary | ICD-10-CM

## 2023-05-30 DIAGNOSIS — O99893 Other specified diseases and conditions complicating puerperium: Secondary | ICD-10-CM

## 2023-05-30 DIAGNOSIS — Z758 Other problems related to medical facilities and other health care: Secondary | ICD-10-CM | POA: Insufficient documentation

## 2023-05-30 DIAGNOSIS — Z3493 Encounter for supervision of normal pregnancy, unspecified, third trimester: Secondary | ICD-10-CM

## 2023-05-30 DIAGNOSIS — Z98891 History of uterine scar from previous surgery: Secondary | ICD-10-CM

## 2023-05-30 DIAGNOSIS — Z603 Acculturation difficulty: Secondary | ICD-10-CM

## 2023-05-30 NOTE — Progress Notes (Signed)
   PRENATAL VISIT NOTE  Subjective:  Christine Hardy is a 32 y.o. [name] at [redacted]w[redacted]d being seen today for ongoing prenatal care.  She is currently monitored for the following issues for this low-risk pregnancy and has Supervision of low-risk pregnancy; History of C-section; Asymptomatic bacteriuria during pregnancy; and Language barrier affecting health care on their problem list.  Patient reports occasional contractions.  Contractions: Irritability. Vag. Bleeding: None.  Movement: Present. Denies leaking of fluid.   The following portions of the patient's history were reviewed and updated as appropriate: allergies, current medications, past family history, past medical history, past social history, past surgical history and problem list.   Objective:   Vitals:   05/30/23 1127  BP: 118/85  Pulse: 87  Weight: 153 lb 14.4 oz (69.8 kg)    Fetal Status: Fetal Heart Rate (bpm): 150   Movement: Present     General:  Alert, oriented and cooperative. Patient is in no acute distress.  Skin: Skin is warm and dry. No rash noted.   Cardiovascular: Normal heart rate noted  Respiratory: Normal respiratory effort, no problems with respiration noted  Abdomen: Soft, gravid, appropriate for gestational age.  Pain/Pressure: Present     Pelvic: Cervical exam deferred        Extremities: Normal range of motion.  Edema: Trace  Mental Status: Normal mood and affect. Normal behavior. Normal judgment and thought content.   Assessment and Plan:  Pregnancy: [name] at [redacted]w[redacted]d 1. Encounter for supervision of low-risk pregnancy in third trimester (Primary) - GBS nv  2. History of C-section - TOLAC consent signed  3. Asymptomatic bacteriuria during pregnancy - TOC neg  4. [redacted] weeks gestation of pregnancy   5. Language barrier affecting health care - INterpreter used  Preterm labor symptoms and general obstetric precautions including but not limited to vaginal bleeding, contractions, leaking of fluid and fetal  movement were reviewed in detail with the patient. Please refer to After Visit Summary for other counseling recommendations.   No follow-ups on file.  No future appointments.  Dorathy Kinsman, CNM

## 2023-05-30 NOTE — Patient Instructions (Addendum)
[email or web address]  

## 2023-06-12 ENCOUNTER — Ambulatory Visit (INDEPENDENT_AMBULATORY_CARE_PROVIDER_SITE_OTHER): Payer: Medicaid Other | Admitting: Obstetrics & Gynecology

## 2023-06-12 ENCOUNTER — Other Ambulatory Visit: Payer: Self-pay

## 2023-06-12 ENCOUNTER — Other Ambulatory Visit (HOSPITAL_COMMUNITY)
Admission: RE | Admit: 2023-06-12 | Discharge: 2023-06-12 | Disposition: A | Discharge status: Home / Self Care | Payer: Medicaid Other | Source: Ambulatory Visit | Attending: Obstetrics & Gynecology | Admitting: Obstetrics & Gynecology

## 2023-06-12 VITALS — BP 132/87 | HR 96 | Wt 156.0 lb

## 2023-06-12 DIAGNOSIS — Z3493 Encounter for supervision of normal pregnancy, unspecified, third trimester: Secondary | ICD-10-CM | POA: Insufficient documentation

## 2023-06-12 DIAGNOSIS — O34219 Maternal care for unspecified type scar from previous cesarean delivery: Secondary | ICD-10-CM | POA: Diagnosis not present

## 2023-06-12 DIAGNOSIS — Z98891 History of uterine scar from previous surgery: Secondary | ICD-10-CM

## 2023-06-12 DIAGNOSIS — Z3A36 36 weeks gestation of pregnancy: Secondary | ICD-10-CM

## 2023-06-12 LAB — CERVICOVAGINAL ANCILLARY ONLY
Chlamydia: NEGATIVE
Comment: NEGATIVE
Comment: NORMAL
Neisseria Gonorrhea: NEGATIVE

## 2023-06-12 NOTE — Progress Notes (Signed)
   PRENATAL VISIT NOTE  Subjective:  Christine Hardy is a 32 y.o. [name] at [redacted]w[redacted]d being seen today for ongoing prenatal care.  Patient is Vietnamese-speaking only, interpreter present for this encounter. She is currently monitored for the following issues for this low-risk pregnancy and has Supervision of low-risk pregnancy; History of C-section; Asymptomatic bacteriuria during pregnancy; and Language barrier affecting health care on their problem list.  Patient reports no complaints.  Contractions: Not present. Vag. Bleeding: None.  Movement: Present. Denies leaking of fluid.   The following portions of the patient's history were reviewed and updated as appropriate: allergies, current medications, past family history, past medical history, past social history, past surgical history and problem list.   Objective:   Vitals:   06/12/23 1109  BP: 132/87  Pulse: 96  Weight: 156 lb (70.8 kg)    Fetal Status: Fetal Heart Rate (bpm): 161 Fundal Height: 36 cm Movement: Present  Presentation: Vertex (Verified by bedside ultrasound)  General:  Alert, oriented and cooperative. Patient is in no acute distress.  Skin: Skin is warm and dry. No rash noted.   Cardiovascular: Normal heart rate noted  Respiratory: Normal respiratory effort, no problems with respiration noted  Abdomen: Soft, gravid, appropriate for gestational age.  Pain/Pressure: Present     Pelvic: Cultures performed in the presence of a chaperone        Extremities: Normal range of motion.  Edema: Trace  Mental Status: Normal mood and affect. Normal behavior. Normal judgment and thought content.   Assessment and Plan:  Pregnancy: [name] at [redacted]w[redacted]d 1. History of C-section Desires TOLAC, consent signed 03/09/23.  2. [redacted] weeks gestation of pregnancy 3. Encounter for supervision of low-risk pregnancy in third trimester (Primary) Cultures done, will follow up results and manage accordingly. - Cervicovaginal ancillary only - Strep Gp B  NAA Preterm labor symptoms and general obstetric precautions including but not limited to vaginal bleeding, contractions, leaking of fluid and fetal movement were reviewed in detail with the patient. Please refer to After Visit Summary for other counseling recommendations.   Return in about 1 week (around 06/19/2023) for OFFICE OB VISIT (MD or APP).  No future appointments.  Jaynie Collins, MD

## 2023-06-12 NOTE — Patient Instructions (Signed)
  Safe Medications in Pregnancy  that are over the counter  Acne:  Benzoyl Peroxide  Salicylic Acid  *NO-Retin A  Backache/Headache:  Tylenol: 2 Regular strength every 4 hours OR               2 Extra strength every 6 hours   Colds/Coughs/Allergies: Allegra Benadryl (alcohol free) 25 mg every 6 hours as needed  Breath right strips  Claritin  Cepacol throat lozenges  Chloraseptic throat spray  Cold-Eeze- up to three times per day  Cough drops, alcohol free  Flonase Guaifenesin  Mucinex (plain/DM) Nasacort Robitussin DM (plain only, alcohol free)  Saline nasal spray/drops Steroid nasal sprays Sudafed (Pseudoephedrine) & Actifed * use only after [redacted] weeks gestation and if you do not have high blood pressure  Tylenol  Vicks Vaporub  Zicam Zinc lozenges  Zyrtec   Make sure to not take anything that has the active ingredient Phenylephrine   Constipation:  Colace  Ducolax suppositories  Fleet enema  Glycerin suppositories  Metamucil  Milk of magnesia  Miralax  Senokot  Smooth move tea   Diarrhea:  Kaopectate  Imodium A-D   *NO Pepto Bismol   Hemorrhoids:  Anusol  Anusol-HC  Preparation-H  Tucks   Indigestion:  Tums  Maalox  Mylanta  Nexium Pepcid  Zantac Prevacid Protonix Prilosec  Insomnia:  Benadryl 25 - 50 mg every 6 hours as needed  Tylenol PM  Unisom  Leg Cramps:  Tums  Magnesium tablets  Nausea/Vomiting:  Bonine  Dramamine  Emetrol  Ginger extract  Sea bands  Meclizine   Nausea medication to take during pregnancy:  Unisom (doxylamine succinate 25 mg tablets) Take one tablet daily at bedtime. If symptoms are not adequately controlled, the dose can be increased to a maximum recommended dose of two tablets daily (1/2 tablet in the morning, 1/2 tablet mid-afternoon and one at bedtime).  Vitamin B6 100mg tablets. Take one tablet twice a day (up to 200 mg per day).   Skin Rashes:  Aveeno products  Benadryl cream or Benadryl  tablets 25mg every 6 hours as needed  Calamine Lotion  1% Hydrocortisone cream   Yeast infection:  Gyne-lotrimin 7  Monistat 3 or 7day   **If taking multiple medications, please check labels to avoid duplicating the same active ingredients  **Take medication as directed on the label ** ** Do not exceed 4000 mg of Tylenol/Acetaminophen in 24 hours**  **Do not take medications that contain Aspirin or Ibuprofen** Unless your doctor has prescribed low dose Aspirin for prevention of Pre-eclampsia       

## 2023-06-14 LAB — STREP GP B NAA: Strep Gp B NAA: NEGATIVE

## 2023-06-14 NOTE — L&D Delivery Note (Signed)
 Delivery Note At 10:08 PM a viable female was delivered via VBAC, Spontaneous (Presentation: LOA     ).  APGAR: 7, 9; weight not available at the time of this note .   Placenta status: delivered spontaneous, Intact.  Cord: 3-vessel  without complications.   Anesthesia: Epidural  Episiotomy:  None Lacerations:  first degree Suture Repair: 4.0 vicryl Est. Blood Loss (mL):  230 cc  Mom to postpartum.  Baby to Couplet care / Skin to Skin.  Indiya Izquierdo 06/20/2023, 10:17 PM

## 2023-06-19 ENCOUNTER — Ambulatory Visit (INDEPENDENT_AMBULATORY_CARE_PROVIDER_SITE_OTHER): Payer: Medicaid Other | Admitting: Obstetrics and Gynecology

## 2023-06-19 ENCOUNTER — Inpatient Hospital Stay (HOSPITAL_COMMUNITY)
Admission: AD | Admit: 2023-06-19 | Discharge: 2023-06-22 | DRG: 805 | Disposition: A | Discharge status: Home / Self Care | Payer: Medicaid Other | Attending: Obstetrics and Gynecology | Admitting: Obstetrics and Gynecology

## 2023-06-19 ENCOUNTER — Encounter (HOSPITAL_COMMUNITY): Payer: Self-pay | Admitting: Obstetrics & Gynecology

## 2023-06-19 ENCOUNTER — Other Ambulatory Visit: Payer: Self-pay

## 2023-06-19 VITALS — BP 140/94 | HR 79 | Wt 157.1 lb

## 2023-06-19 DIAGNOSIS — Z3A37 37 weeks gestation of pregnancy: Secondary | ICD-10-CM | POA: Diagnosis not present

## 2023-06-19 DIAGNOSIS — O34219 Maternal care for unspecified type scar from previous cesarean delivery: Secondary | ICD-10-CM | POA: Diagnosis present

## 2023-06-19 DIAGNOSIS — O1404 Mild to moderate pre-eclampsia, complicating childbirth: Secondary | ICD-10-CM | POA: Diagnosis present

## 2023-06-19 DIAGNOSIS — Z3493 Encounter for supervision of normal pregnancy, unspecified, third trimester: Secondary | ICD-10-CM

## 2023-06-19 DIAGNOSIS — O99214 Obesity complicating childbirth: Secondary | ICD-10-CM | POA: Diagnosis present

## 2023-06-19 DIAGNOSIS — Z8249 Family history of ischemic heart disease and other diseases of the circulatory system: Secondary | ICD-10-CM

## 2023-06-19 DIAGNOSIS — Z98891 History of uterine scar from previous surgery: Secondary | ICD-10-CM

## 2023-06-19 DIAGNOSIS — O34211 Maternal care for low transverse scar from previous cesarean delivery: Secondary | ICD-10-CM | POA: Diagnosis not present

## 2023-06-19 DIAGNOSIS — O1493 Unspecified pre-eclampsia, third trimester: Principal | ICD-10-CM

## 2023-06-19 DIAGNOSIS — O411230 Chorioamnionitis, third trimester, not applicable or unspecified: Secondary | ICD-10-CM | POA: Diagnosis present

## 2023-06-19 DIAGNOSIS — Z603 Acculturation difficulty: Secondary | ICD-10-CM

## 2023-06-19 DIAGNOSIS — R030 Elevated blood-pressure reading, without diagnosis of hypertension: Secondary | ICD-10-CM | POA: Diagnosis present

## 2023-06-19 DIAGNOSIS — Z758 Other problems related to medical facilities and other health care: Secondary | ICD-10-CM

## 2023-06-19 DIAGNOSIS — O99891 Other specified diseases and conditions complicating pregnancy: Secondary | ICD-10-CM

## 2023-06-19 DIAGNOSIS — R8271 Bacteriuria: Secondary | ICD-10-CM

## 2023-06-19 DIAGNOSIS — O1490 Unspecified pre-eclampsia, unspecified trimester: Secondary | ICD-10-CM | POA: Diagnosis present

## 2023-06-19 LAB — CBC
HCT: 39.3 % (ref 36.0–46.0)
HCT: 37.6 % (ref 36.0–46.0)
Hemoglobin: 12.4 g/dL (ref 12.0–15.0)
Hemoglobin: 12.1 g/dL (ref 12.0–15.0)
MCH: 24.6 pg — ABNORMAL LOW (ref 26.0–34.0)
MCH: 25.2 pg — ABNORMAL LOW (ref 26.0–34.0)
MCHC: 31.6 g/dL (ref 30.0–36.0)
MCHC: 32.2 g/dL (ref 30.0–36.0)
MCV: 78 fL — ABNORMAL LOW (ref 80.0–100.0)
MCV: 78.3 fL — ABNORMAL LOW (ref 80.0–100.0)
Platelets: 333 10*3/uL (ref 150–400)
Platelets: 349 10*3/uL (ref 150–400)
RBC: 5.04 MIL/uL (ref 3.87–5.11)
RBC: 4.8 MIL/uL (ref 3.87–5.11)
RDW: 13.9 % (ref 11.5–15.5)
RDW: 13.8 % (ref 11.5–15.5)
WBC: 8.7 10*3/uL (ref 4.0–10.5)
WBC: 9.6 10*3/uL (ref 4.0–10.5)
nRBC: 0 % (ref 0.0–0.2)
nRBC: 0 % (ref 0.0–0.2)

## 2023-06-19 LAB — COMPREHENSIVE METABOLIC PANEL
ALT: 13 U/L (ref 0–44)
ALT: 13 U/L (ref 0–44)
AST: 20 U/L (ref 15–41)
AST: 20 U/L (ref 15–41)
Albumin: 2.8 g/dL — ABNORMAL LOW (ref 3.5–5.0)
Albumin: 2.8 g/dL — ABNORMAL LOW (ref 3.5–5.0)
Alkaline Phosphatase: 116 U/L (ref 38–126)
Alkaline Phosphatase: 116 U/L (ref 38–126)
Anion gap: 12 (ref 5–15)
Anion gap: 10 (ref 5–15)
BUN: 7 mg/dL (ref 6–20)
BUN: 6 mg/dL (ref 6–20)
CO2: 18 mmol/L — ABNORMAL LOW (ref 22–32)
CO2: 21 mmol/L — ABNORMAL LOW (ref 22–32)
Calcium: 9.1 mg/dL (ref 8.9–10.3)
Calcium: 9.2 mg/dL (ref 8.9–10.3)
Chloride: 102 mmol/L (ref 98–111)
Chloride: 107 mmol/L (ref 98–111)
Creatinine, Ser: 0.51 mg/dL (ref 0.44–1.00)
Creatinine, Ser: 0.45 mg/dL (ref 0.44–1.00)
GFR, Estimated: >60 mL/min (ref >60)
GFR, Estimated: >60 mL/min (ref >60)
Glucose, Bld: 79 mg/dL (ref 70–99)
Glucose, Bld: 75 mg/dL (ref 70–99)
Potassium: 3.7 mmol/L (ref 3.5–5.1)
Potassium: 3.9 mmol/L (ref 3.5–5.1)
Sodium: 132 mmol/L — ABNORMAL LOW (ref 135–145)
Sodium: 138 mmol/L (ref 135–145)
Total Bilirubin: 0.4 mg/dL (ref 0.0–1.2)
Total Bilirubin: 0.5 mg/dL (ref 0.0–1.2)
Total Protein: 7.1 g/dL (ref 6.5–8.1)
Total Protein: 7.2 g/dL (ref 6.5–8.1)

## 2023-06-19 LAB — TYPE AND SCREEN
ABO/RH(D): A POS
Antibody Screen: NEGATIVE

## 2023-06-19 LAB — PROTEIN / CREATININE RATIO, URINE
Creatinine, Urine: 29 mg/dL
Protein Creatinine Ratio: 0.41 mg/mg{creat} — ABNORMAL HIGH (ref 0.00–0.15)
Total Protein, Urine: 12 mg/dL

## 2023-06-19 MED ORDER — OXYTOCIN-SODIUM CHLORIDE 30-0.9 UT/500ML-% IV SOLN: 1.0000 m[IU]/min | INTRAVENOUS | Status: DC

## 2023-06-19 MED ORDER — SOD CITRATE-CITRIC ACID 500-334 MG/5ML PO SOLN: 30.0000 mL | ORAL | Status: DC | PRN

## 2023-06-19 MED ORDER — ONDANSETRON HCL 4 MG/2ML IJ SOLN: 4.0000 mg | Freq: Four times a day (QID) | INTRAMUSCULAR | Status: DC | PRN

## 2023-06-19 MED ORDER — TERBUTALINE SULFATE 1 MG/ML IJ SOLN
0.2500 mg | Freq: Once | INTRAMUSCULAR | Status: DC | PRN
  Filled 2023-06-19: qty 1

## 2023-06-19 MED ORDER — ACETAMINOPHEN 325 MG PO TABS
650.0000 mg | ORAL_TABLET | ORAL | Status: DC | PRN
  Administered 2023-06-20: 650 mg via ORAL
  Filled 2023-06-19: qty 2

## 2023-06-19 MED ORDER — LACTATED RINGERS IV SOLN
500.0000 mL | INTRAVENOUS | Status: DC | PRN
  Administered 2023-06-20: 250 mL via INTRAVENOUS

## 2023-06-19 MED ORDER — OXYTOCIN-SODIUM CHLORIDE 30-0.9 UT/500ML-% IV SOLN
1.0000 m[IU]/min | INTRAVENOUS | Status: DC
  Administered 2023-06-19: 1 m[IU]/min via INTRAVENOUS
  Administered 2023-06-20: 4 m[IU]/min via INTRAVENOUS
  Administered 2023-06-20: 1 m[IU]/min via INTRAVENOUS

## 2023-06-19 MED ORDER — LACTATED RINGERS IV SOLN: INTRAVENOUS | Status: AC

## 2023-06-19 MED ORDER — LIDOCAINE HCL (PF) 1 % IJ SOLN: 30.0000 mL | INTRAMUSCULAR | Status: DC | PRN

## 2023-06-19 MED ORDER — OXYTOCIN-SODIUM CHLORIDE 30-0.9 UT/500ML-% IV SOLN
2.5000 [IU]/h | INTRAVENOUS | Status: DC
  Administered 2023-06-20: 2.5 [IU]/h via INTRAVENOUS
  Filled 2023-06-19: qty 500

## 2023-06-19 MED ORDER — OXYCODONE-ACETAMINOPHEN 5-325 MG PO TABS: 1.0000 | ORAL_TABLET | ORAL | Status: DC | PRN

## 2023-06-19 MED ORDER — OXYCODONE-ACETAMINOPHEN 5-325 MG PO TABS: 2.0000 | ORAL_TABLET | ORAL | Status: DC | PRN

## 2023-06-19 MED ORDER — OXYTOCIN BOLUS FROM INFUSION
333.0000 mL | Freq: Once | INTRAVENOUS | Status: AC
  Administered 2023-06-20: 333 mL via INTRAVENOUS

## 2023-06-19 NOTE — Progress Notes (Signed)
 LABOR PROGRESS NOTE  Christine Hardy is a 33 y.o. [name] at [redacted]w[redacted]d  admitted for new onset elevated pressures.  Subjective: Accompanied by husband and mother in law. She is doing well. Reports some contractions starting, but minimal discomfort.  Objective: BP (!) 143/91   Pulse 81   Temp 98.1 F (36.7 C) (Oral)   Resp 16   Ht 4' 9 (1.448 m)   Wt 71.3 kg   LMP 08/23/2022 (Within Weeks)   SpO2 96%   BMI 34.00 kg/m  or  Vitals:   06/19/23 1250 06/19/23 1301 06/19/23 1404 06/19/23 1614  BP: 139/88 123/85 138/89 (!) 143/91  Pulse: 74 78 87 81  Resp:   16   Temp:   98.1 F (36.7 C)   TempSrc:   Oral   SpO2:      Weight:      Height:        Dilation: Fingertip Effacement (%): Thick Cervical Position: Posterior Station: Ballotable Presentation: Vertex Exam by:: Dr. Ilean FHT: baseline rate 140, moderate varibility, accelerations present, no decelerations   Labs: Lab Results  Component Value Date   WBC 8.7 06/19/2023   HGB 12.4 06/19/2023   HCT 39.3 06/19/2023   MCV 78.0 (L) 06/19/2023   PLT 333 06/19/2023    Patient Active Problem List   Diagnosis Date Noted   Preeclampsia 06/19/2023   Normal labor and delivery 06/19/2023   Language barrier affecting health care 05/30/2023   Asymptomatic bacteriuria during pregnancy 02/19/2023   Supervision of low-risk pregnancy 02/14/2023   History of C-section 02/14/2023    Assessment / Plan: 33 y.o. [name] at [redacted]w[redacted]d here for mild pre-eclampsia. TOLAC.  Labor: Minimal cervical change so far with pitocin , thick cervix with minimal effacement. Attempted foley placement but unsuccessful. Fetal Wellbeing:  Cat I Pain Control:  none needed at this time Anticipated MOD:  NSVD  Steffan Ilean, MD Attending Family Medicine Physician, [facility] for [facility], [address]  06/19/2023 6:17 PM

## 2023-06-19 NOTE — Progress Notes (Signed)
 Patient Vitals for the past 4 hrs:  BP Temp Temp src Pulse Resp  06/19/23 1955 132/88 98.7 F (37.1 C) Oral 85 16   Labs stable. Starting to feel crampy w/ctx.  Ctx q 1.5-3 minutes, pitocin  at 5 mu/min. FHR Cat 1 w/frequent accels.  Cx FT/a solid 3 inches long/-3.  Will continue to ripen cx w/low dose pitocin .

## 2023-06-19 NOTE — Progress Notes (Signed)
   PRENATAL VISIT NOTE  Subjective:  Christine Hardy is a 33 y.o. [name] at [redacted]w[redacted]d being seen today for ongoing prenatal care.  She is currently monitored for the following issues for this high-risk pregnancy and has Supervision of low-risk pregnancy; History of C-section; Asymptomatic bacteriuria during pregnancy; and Language barrier affecting health care on their problem list.  Patient doing well with no acute concerns today. She reports no complaints.  Contractions: Not present. Vag. Bleeding: None.  Movement: Present. Denies leaking of fluid.   The following portions of the patient's history were reviewed and updated as appropriate: allergies, current medications, past family history, past medical history, past social history, past surgical history and problem list. Problem list updated.  Objective:   Vitals:   06/19/23 1027  BP: (!) 138/93  Pulse: 81  Weight: 157 lb 1.6 oz (71.3 kg)    Fetal Status: Fetal Heart Rate (bpm): 156 Fundal Height: 37 cm Movement: Present     General:  Alert, oriented and cooperative. Patient is in no acute distress.  Skin: Skin is warm and dry. No rash noted.   Cardiovascular: Normal heart rate noted  Respiratory: Normal respiratory effort, no problems with respiration noted  Abdomen: Soft, gravid, appropriate for gestational age.  Pain/Pressure: Present     Pelvic: Cervical exam deferred        Extremities: Normal range of motion.  Edema: Trace  Mental Status:  Normal mood and affect. Normal behavior. Normal judgment and thought content.   Assessment and Plan:  Pregnancy: [name] at [redacted]w[redacted]d  1. [redacted] weeks gestation of pregnancy (Primary)   2. History of C-section Pt desires VBAC, conssent previously signed  3. Language barrier affecting health care Live interpreter present  4. Encounter for supervision of low-risk pregnancy in third trimester Pt had mildly elevated BP today.  No headache, visual changes or RUQ pain noted.  Pt advised to have further  eval at MAU with possibility of delivery if elevated BP remains persistent.  MAU staff has been contacted.  Term labor symptoms and general obstetric precautions including but not limited to vaginal bleeding, contractions, leaking of fluid and fetal movement were reviewed in detail with the patient.  Please refer to After Visit Summary for other counseling recommendations.  If undelivered, follow up in 1 week Return in about 1 week (around 06/26/2023) for [facility], in person.   Jerilynn Buddle, MD Faculty Attending Center for [facility]

## 2023-06-19 NOTE — MAU Note (Signed)
..  Christine Hardy is a 33 y.o. at [redacted]w[redacted]d here in MAU reporting: sent over from office for hypertension. SBP 130's and 140's. Denies vaginal bleeding or LOF. +FM  Onset of complaint: 06/18/2022 Pain score: 0/10 Vitals:   06/19/23 1209  BP: (!) 141/95  Pulse: 90  Resp: 16  Temp: 98.5 F (36.9 C)  SpO2: 96%     FHT:145 Lab orders placed from triage: UA

## 2023-06-19 NOTE — MAU Provider Note (Addendum)
  History     CSN: [id number]  Arrival date and time: 06/19/23 1130   Event Date/Time   First Provider Initiated Contact with Patient    Chief Complaint  Patient presents with   Hypertension    HPI  Christine Hardy is a 33 y.o. [name] at [redacted]w[redacted]d who presents to the MAU for evaluation of elevated BP. New onset of elevated BP today at her prenatal visit today. No hx gHTN or pre-e. No headache, vision changes, SOB, RUQ pain, new edema.  Past Medical History:  Diagnosis Date   Medical history non-contributory     Past Surgical History:  Procedure Laterality Date   CESAREAN SECTION      Family History  Problem Relation Age of Onset   Diabetes Mother    Hypertension Mother    Diabetes Father     Social History   Tobacco Use   Smoking status: Never   Smokeless tobacco: Never  Vaping Use   Vaping status: Never Used  Substance Use Topics   Alcohol use: Never   Drug use: Never    Allergies: No Known Allergies  Medications Prior to Admission  Medication Sig Dispense Refill Last Dose/Taking   Prenatal Vit-Fe Fumarate-FA (PRENATAL PLUS VITAMIN/MINERAL) 27-1 MG TABS Take 1 tablet by mouth daily. 30 tablet 11 06/19/2023    ROS reviewed and pertinent positives and negatives as documented in HPI.  Physical Exam   Blood pressure 123/85, pulse 78, temperature 98.5 F (36.9 C), resp. rate 16, height 4' 9 (1.448 m), weight 71.3 kg, last menstrual period 08/23/2022, SpO2 96%.  Physical Exam Constitutional:      General: She is not in acute distress.    Appearance: Normal appearance. She is not ill-appearing.  HENT:     Head: Normocephalic and atraumatic.  Cardiovascular:     Rate and Rhythm: Normal rate.  Pulmonary:     Effort: Pulmonary effort is normal.     Breath sounds: Normal breath sounds.  Abdominal:     Palpations: Abdomen is soft.     Tenderness: There is no abdominal tenderness. There is no guarding.  Musculoskeletal:        General: Normal range of motion.   Skin:    General: Skin is warm and dry.     Findings: No rash.  Neurological:     General: No focal deficit present.     Mental Status: She is alert and oriented to person, place, and time.    EFM: 135/mod/+a/-d Cephalic by BSUS MAU Course  Procedures  MDM 33 y.o. [name] at [redacted]w[redacted]d presenting for PEC w/up. New onset elevated BP in clinic today, repeat BP elevated on arrival here with PCR of 0.4. Will admit for IOL given new onset mild pre-eclampsia at [redacted]w[redacted]d.   Assessment and Plan  Pre-eclampsia in third trimester Plan for admission for IOL   Alain Sor, MD OB Fellow, Faculty Practice [facility], Center for [facility]  06/19/2023, 1:22 PM

## 2023-06-19 NOTE — H&P (Signed)
 OBSTETRIC ADMISSION HISTORY AND PHYSICAL  Christine Hardy is a 33 y.o. female [name] with IUP at [redacted]w[redacted]d by US  presenting for new onset elevated blood pressures noted at regular prenatal appointment today. She reports +FMs, No LOF, no VB, no blurry vision, headaches or peripheral edema, and RUQ pain.  She plans on breast and formula feeding. She is unsure about plans for birth control. She received her prenatal care at  [facility].    Dating: By US  --->  Estimated Date of Delivery: 07/08/23  Sono:    @[redacted]w[redacted]d , CWD, normal anatomy, cephalic presentation, vertex lie, 879g, 51% EFW   Prenatal History/Complications:  Asymptomatic bacteriuria   Past Medical History: Past Medical History:  Diagnosis Date   Medical history non-contributory     Past Surgical History: Past Surgical History:  Procedure Laterality Date   CESAREAN SECTION      Obstetrical History: OB History     Gravida  2   Para  1   Term  1   Preterm  0   AB  0   Living  1      SAB  0   IAB  0   Ectopic  0   Multiple  0   Live Births  1           Social History Social History   Socioeconomic History   Marital status: Significant Other    Spouse name: Not on file   Number of children: Not on file   Years of education: Not on file   Highest education level: Not on file  Occupational History   Occupation: Advertising Account Planner  Tobacco Use   Smoking status: Never   Smokeless tobacco: Never  Vaping Use   Vaping status: Never Used  Substance and Sexual Activity   Alcohol use: Never   Drug use: Never   Sexual activity: Yes    Birth control/protection: None  Other Topics Concern   Not on file  Social History Narrative   Not on file   Social Drivers of Health   Financial Resource Strain: Not on file  Food Insecurity: No Food Insecurity (06/19/2023)   Hunger Vital Sign    Worried About Running Out of Food in the Last Year: Never true    Ran Out of Food in the Last Year: Never true   Transportation Needs: No Transportation Needs (06/19/2023)   PRAPARE - Administrator, Civil Service (Medical): No    Lack of Transportation (Non-Medical): No  Physical Activity: Not on file  Stress: Not on file  Social Connections: Socially Isolated (06/19/2023)   Social Connection and Isolation Panel [NHANES]    Frequency of Communication with Friends and Family: Never    Frequency of Social Gatherings with Friends and Family: Never    Attends Religious Services: Never    Diplomatic Services Operational Officer: Not on file    Attends Banker Meetings: Never    Marital Status: Living with partner    Family History: Family History  Problem Relation Age of Onset   Diabetes Mother    Hypertension Mother    Diabetes Father     Allergies: No Known Allergies  Medications Prior to Admission  Medication Sig Dispense Refill Last Dose/Taking   Prenatal Vit-Fe Fumarate-FA (PRENATAL PLUS VITAMIN/MINERAL) 27-1 MG TABS Take 1 tablet by mouth daily. 30 tablet 11 06/19/2023     Review of Systems   All systems reviewed and negative except as stated in HPI  Blood pressure 138/89, pulse 87, temperature 98.1 F (36.7 C), temperature source Oral, resp. rate 16, height 4' 9 (1.448 m), weight 71.3 kg, last menstrual period 08/23/2022, SpO2 96%. General appearance: alert and cooperative Lungs: clear to auscultation bilaterally Heart: regular rate and rhythm Abdomen: soft, non-tender; bowel sounds normal Pelvic: cervix approximately 1cm dilated, 0% effaced Extremities: Homans sign is negative, no sign of DVT DTR's intact Presentation: cephalic Fetal monitoringBaseline: 150 bpm Uterine activityNone Dilation: Fingertip Effacement (%): Thick Exam by:: Dr. Ilean   Prenatal labs: ABO, Rh: --/--/A POS (01/06 1211) Antibody: NEG (01/06 1211) Rubella: 5.03 (09/03 0925) RPR: Non Reactive (10/24 9062)  HBsAg: Negative (09/03 0925)  HIV: Non Reactive (10/24 0937)   GBS: Negative/-- (12/30 1137)  1 hr Glucola - Failed 1 hour; normal 3 hour Genetic screening  - NIPS LR female; declined further screening Anatomy US  normal  Prenatal Transfer Tool  Maternal Diabetes: No Genetic Screening: Normal Maternal Ultrasounds/Referrals: Normal Fetal Ultrasounds or other Referrals:  Referred to Materal Fetal Medicine  Maternal Substance Abuse:  No Significant Maternal Medications:  None Significant Maternal Lab Results:  Group B Strep negative Number of Prenatal Visits:greater than 3 verified prenatal visits Other Comments:  None  Results for orders placed or performed during the hospital encounter of 06/19/23 (from the past 24 hours)  Protein / creatinine ratio, urine   Collection Time: 06/19/23 11:42 AM  Result Value Ref Range   Creatinine, Urine 29 mg/dL   Total Protein, Urine 12 mg/dL   Protein Creatinine Ratio 0.41 (H) 0.00 - 0.15 mg/mg[Cre]  CBC   Collection Time: 06/19/23 12:11 PM  Result Value Ref Range   WBC 8.7 4.0 - 10.5 K/uL   RBC 5.04 3.87 - 5.11 MIL/uL   Hemoglobin 12.4 12.0 - 15.0 g/dL   HCT 60.6 63.9 - 53.9 %   MCV 78.0 (L) 80.0 - 100.0 fL   MCH 24.6 (L) 26.0 - 34.0 pg   MCHC 31.6 30.0 - 36.0 g/dL   RDW 86.0 88.4 - 84.4 %   Platelets 333 150 - 400 K/uL   nRBC 0.0 0.0 - 0.2 %  Comprehensive metabolic panel   Collection Time: 06/19/23 12:11 PM  Result Value Ref Range   Sodium 132 (L) 135 - 145 mmol/L   Potassium 3.7 3.5 - 5.1 mmol/L   Chloride 102 98 - 111 mmol/L   CO2 18 (L) 22 - 32 mmol/L   Glucose, Bld 79 70 - 99 mg/dL   BUN 7 6 - 20 mg/dL   Creatinine, Ser 9.48 0.44 - 1.00 mg/dL   Calcium 9.1 8.9 - 89.6 mg/dL   Total Protein 7.1 6.5 - 8.1 g/dL   Albumin 2.8 (L) 3.5 - 5.0 g/dL   AST 20 15 - 41 U/L   ALT 13 0 - 44 U/L   Alkaline Phosphatase 116 38 - 126 U/L   Total Bilirubin 0.4 0.0 - 1.2 mg/dL   GFR, Estimated >39 >39 mL/min   Anion gap 12 5 - 15  Type and screen [facility]   Collection Time: 06/19/23  12:11 PM  Result Value Ref Range   ABO/RH(D) A POS    Antibody Screen NEG    Sample Expiration      06/22/2023,2359 Performed at [facility], [address] [place], [place] [address]     Patient Active Problem List   Diagnosis Date Noted   Preeclampsia 06/19/2023   Normal labor and delivery 06/19/2023   Language barrier affecting  health care 05/30/2023   Asymptomatic bacteriuria during pregnancy 02/19/2023   Supervision of low-risk pregnancy 02/14/2023   History of C-section 02/14/2023    Assessment/Plan:  Christine Hardy is a 33 y.o. [name] at [redacted]w[redacted]d here for new onset mild pre-eclampsia. Proceed with induction of labor with intention for VBAC.  #Labor: Initiation with pitocin , plan to place foley and continue pitocin . Monitor pre-e labs q8h. #Pain: Per pt request; interest in opioid pain medicine and nitrous oxide, would like to avoid epidural #FWB: Cat I #ID:  GBS negative #MOF: Breast and formula #MOC: unsure #Circ:  No  Lauraine Norse, DO  06/19/2023, 4:51 PM

## 2023-06-20 ENCOUNTER — Encounter (HOSPITAL_COMMUNITY): Payer: Self-pay | Admitting: Family Medicine

## 2023-06-20 ENCOUNTER — Inpatient Hospital Stay (HOSPITAL_COMMUNITY): Payer: Medicaid Other | Admitting: Anesthesiology

## 2023-06-20 DIAGNOSIS — O411230 Chorioamnionitis, third trimester, not applicable or unspecified: Secondary | ICD-10-CM

## 2023-06-20 DIAGNOSIS — O34211 Maternal care for low transverse scar from previous cesarean delivery: Secondary | ICD-10-CM

## 2023-06-20 DIAGNOSIS — Z3A37 37 weeks gestation of pregnancy: Secondary | ICD-10-CM

## 2023-06-20 DIAGNOSIS — O1404 Mild to moderate pre-eclampsia, complicating childbirth: Secondary | ICD-10-CM

## 2023-06-20 LAB — CBC
HCT: 33.6 % — ABNORMAL LOW (ref 36.0–46.0)
Hemoglobin: 10.9 g/dL — ABNORMAL LOW (ref 12.0–15.0)
MCH: 25.1 pg — ABNORMAL LOW (ref 26.0–34.0)
MCHC: 32.4 g/dL (ref 30.0–36.0)
MCV: 77.2 fL — ABNORMAL LOW (ref 80.0–100.0)
Platelets: 259 10*3/uL (ref 150–400)
RBC: 4.35 MIL/uL (ref 3.87–5.11)
RDW: 13.8 % (ref 11.5–15.5)
WBC: 23.7 10*3/uL — ABNORMAL HIGH (ref 4.0–10.5)
nRBC: 0 % (ref 0.0–0.2)

## 2023-06-20 LAB — RPR: RPR Ser Ql: NONREACTIVE

## 2023-06-20 MED ORDER — SODIUM CHLORIDE 0.9 % IV SOLN
2.0000 g | Freq: Four times a day (QID) | INTRAVENOUS | Status: DC
  Administered 2023-06-20 – 2023-06-21 (×5): 2 g via INTRAVENOUS
  Filled 2023-06-20 (×5): qty 2000

## 2023-06-20 MED ORDER — DIPHENHYDRAMINE HCL 50 MG/ML IJ SOLN
25.0000 mg | Freq: Once | INTRAMUSCULAR | Status: AC | PRN
  Administered 2023-06-20: 25 mg via INTRAVENOUS
  Filled 2023-06-20: qty 1

## 2023-06-20 MED ORDER — LACTATED RINGERS IV SOLN
500.0000 mL | INTRAVENOUS | Status: DC | PRN
  Administered 2023-06-20 (×2): 250 mL via INTRAVENOUS
  Administered 2023-06-20: 500 mL via INTRAVENOUS

## 2023-06-20 MED ORDER — GENTAMICIN SULFATE 40 MG/ML IJ SOLN
5.0000 mg/kg | INTRAVENOUS | Status: DC
  Administered 2023-06-20 – 2023-06-21 (×2): 360 mg via INTRAVENOUS
  Filled 2023-06-20 (×2): qty 9

## 2023-06-20 MED ORDER — ACETAMINOPHEN 500 MG PO TABS
1000.0000 mg | ORAL_TABLET | Freq: Four times a day (QID) | ORAL | Status: DC | PRN
  Administered 2023-06-20: 1000 mg via ORAL
  Filled 2023-06-20: qty 2

## 2023-06-20 MED ORDER — PHENYLEPHRINE 80 MCG/ML (10ML) SYRINGE FOR IV PUSH (FOR BLOOD PRESSURE SUPPORT)
80.0000 ug | PREFILLED_SYRINGE | INTRAVENOUS | Status: DC | PRN
  Filled 2023-06-20: qty 10

## 2023-06-20 MED ORDER — BUPIVACAINE HCL (PF) 0.25 % IJ SOLN
INTRAMUSCULAR | Status: DC | PRN
  Administered 2023-06-20 (×2): 5 mL via EPIDURAL

## 2023-06-20 MED ORDER — LIDOCAINE HCL (PF) 1 % IJ SOLN
INTRAMUSCULAR | Status: DC | PRN
  Administered 2023-06-20 (×2): 4 mL via EPIDURAL

## 2023-06-20 MED ORDER — EPHEDRINE 5 MG/ML INJ: 10.0000 mg | INTRAVENOUS | Status: DC | PRN

## 2023-06-20 MED ORDER — DIPHENHYDRAMINE HCL 50 MG/ML IJ SOLN: 12.5000 mg | INTRAMUSCULAR | Status: DC | PRN

## 2023-06-20 MED ORDER — FENTANYL-BUPIVACAINE-NACL 0.5-0.125-0.9 MG/250ML-% EP SOLN
12.0000 mL/h | EPIDURAL | Status: DC | PRN
  Administered 2023-06-20: 10.5 mL/h via EPIDURAL
  Administered 2023-06-20: 12 mL/h via EPIDURAL
  Filled 2023-06-20 (×2): qty 250

## 2023-06-20 MED ORDER — FENTANYL CITRATE (PF) 100 MCG/2ML IJ SOLN
100.0000 ug | INTRAMUSCULAR | Status: DC | PRN
  Administered 2023-06-20: 100 ug via INTRAVENOUS
  Filled 2023-06-20: qty 2

## 2023-06-20 MED ORDER — PHENYLEPHRINE 80 MCG/ML (10ML) SYRINGE FOR IV PUSH (FOR BLOOD PRESSURE SUPPORT)
80.0000 ug | PREFILLED_SYRINGE | INTRAVENOUS | Status: AC | PRN
  Administered 2023-06-20 (×3): 80 ug via INTRAVENOUS

## 2023-06-20 MED ORDER — ZOLPIDEM TARTRATE 5 MG PO TABS: 5.0000 mg | ORAL_TABLET | Freq: Every evening | ORAL | Status: DC | PRN

## 2023-06-20 MED ORDER — LACTATED RINGERS IV SOLN
500.0000 mL | Freq: Once | INTRAVENOUS | Status: AC
  Administered 2023-06-20: 500 mL via INTRAVENOUS

## 2023-06-20 MED ORDER — LACTATED RINGERS AMNIOINFUSION
INTRAVENOUS | Status: DC
  Administered 2023-06-20: 200 mL via INTRAUTERINE

## 2023-06-20 NOTE — Progress Notes (Signed)
 Labor Progress Note Christine Hardy is a 33 y.o. [name] at [redacted]w[redacted]d presented for TOLAc S: Neck pain resolved. More pressure with contractions.   O:  BP 133/71   Pulse 98   Temp 100.2 F (37.9 C) (Axillary)   Resp 16   Ht 4' 9 (1.448 m)   Wt 71.3 kg   LMP 08/23/2022 (Within Weeks)   SpO2 99%   BMI 34.00 kg/m  EFM: 130/moderate variablity/accels present/mild early decels  CVE: Dilation: Lip/rim Effacement (%): 100 Cervical Position: Posterior Station: -1, 0 Presentation: Vertex Exam by:: Dr. Loyola   A&P: 33 y.o. [name] [redacted]w[redacted]d admitted for IOL #Labor: Progressing well. Small anterior lip, not reducible with a few pushing attempts. Right exacerated runners. Will reassess in an hour.  #Pain: Epidural #FWB: Cat II; overall reassuring and progressing #GBS negative   #Chorioamnionitis: Started on amp/gent.   Mardy Loyola, MD 4:50 PM

## 2023-06-20 NOTE — Progress Notes (Signed)
 Notified by nursing of maternal fever  Blood pressure 119/83, pulse 99, temperature (!) 101 F (38.3 C), resp. rate 16, height 4' 9 (1.448 m), weight 71.3 kg, last menstrual period 08/23/2022, SpO2 98%.  Meets criteria for chorioamnionitis Will treat with 1g Tylenol , Amp/Gent Now making change with pitocin  and AROM; ruptured nearly 12 hours.   Continue to titrate pitocin  as baby tolerates  [name], MD FMOB Fellow, Faculty practice [facility], Center for [facility]

## 2023-06-20 NOTE — Progress Notes (Signed)
 Balloon out and SROM w/clear fluid.  Cx 3/50/-2  FHR 130, moderate variability, + accels, had 2 mild variables, otherwise Cat 1. Ctx q 2 minutes. Pitocin at 6 mu/min. Continue present mgt.

## 2023-06-20 NOTE — Progress Notes (Addendum)
 Labor Progress Note Christine Hardy is a 33 y.o. [name] at [redacted]w[redacted]d presented gHTN > now Pre-E w/o SF with UP:C 0.41. S: Reviewed strip: recurrent variable decelerations despite maternal repositioning by RNs.   O:  BP 106/76   Pulse 92   Temp 99.2 F (37.3 C) (Oral)   Resp 16   Ht 4' 9 (1.448 m)   Wt 71.3 kg   LMP 08/23/2022 (Within Weeks)   SpO2 95%   BMI 34.00 kg/m  EFM: Baseline 135/mod variability/accels+/recurrent deep variable decelerations Toco: q5 min CVE: Dilation: 3 Effacement (%): 50 Cervical Position: Posterior Station: -2 Presentation: Vertex Exam by:: [name], RN   A&P: 33 y.o. [name] [redacted]w[redacted]d  #Labor: Latent labor, exam unchanged since 2a. Pitocin  restarted around 5a, now up to 4 mu/min. Start amnioinfusion for recurrent variable decels, bolus maintenance #Pain: Epidural in place.  #FWB: Cat II tracing due to recurrent deep variables, but reassuring maintenance of baseline and variability. Starting amnioinfusion with bolus + maintenance.  #GBS negative  #Pre-E w/o SF: One mild range BP this morning, otherwise normotensive.   [name], MD 10:36 AM  GME ATTESTATION:  Evaluation and management procedures were performed by the [facility] Medicine Resident under my supervision. I was immediately available for direct supervision, assistance and direction throughout this encounter.  I also confirm that I have verified the information documented in the resident's note, and that I have also personally reperformed the pertinent components of the physical exam and all of the medical decision making activities.  I have also made any necessary editorial changes.  [name], MD OB Fellow, Faculty [facility], Center for [facility] 06/20/2023 11:25 AM

## 2023-06-20 NOTE — Progress Notes (Signed)
 Patient Vitals for the past 4 hrs:  BP Pulse  06/19/23 2207 126/83 82   Ctx q 1-3 minutes, pitocin  at 6 mu/min.  Pt still rather crampy.  Cx FT/20/-2.  Foley inserted and inflated w/40cc H20.  Will try to add another 20cc in a little bit if pt tolerates.

## 2023-06-20 NOTE — Progress Notes (Signed)
 Labor Progress Note Christine Hardy is a 33 y.o. [name] at [redacted]w[redacted]d presented for TOLAC S: Moderate left trap pain, recently pressed epidural button several time for increasing contraction pain.   O:  BP 126/81   Pulse 98   Temp (!) 101 F (38.3 C) (Axillary)   Resp 16   Ht 4' 9 (1.448 m)   Wt 71.3 kg   LMP 08/23/2022 (Within Weeks)   SpO2 100%   BMI 34.00 kg/m  EFM: 140/moderate variablity/accels present/intermittent late decels improved with repositioning  CVE: Dilation: 6.5 (swollen; maternal right) Effacement (%): 100 Cervical Position: Posterior Station: -1 Presentation: Vertex Exam by:: Candida Rink, RN   A&P: 33 y.o. [name] [redacted]w[redacted]d admitted for IOL #Labor: Progressing well. Stretchy cervix, but asymmetric and swelling #Pain: Epidural; heat helping neck pain #FWB: Cat II; overall reassuring #GBS negative  #Chorioamnionitis: Started on amp/gent.   [name], MD 2:50 PM

## 2023-06-20 NOTE — Anesthesia Procedure Notes (Addendum)
 Epidural Patient location during procedure: OB Start time: 06/20/2023 3:19 AM End time: 06/20/2023 3:28 AM  Staffing Anesthesiologist: Jerrye Sharper, MD Performed: anesthesiologist   Preanesthetic Checklist Completed: patient identified, IV checked, site marked, risks and benefits discussed, surgical consent, monitors and equipment checked, pre-op evaluation and timeout performed  Epidural Patient position: sitting Prep: DuraPrep and site prepped and draped Patient monitoring: continuous pulse ox and blood pressure Approach: midline Location: L3-L4 Injection technique: LOR air  Needle:  Needle type: Tuohy  Needle gauge: 17 G Needle length: 9 cm and 9 Needle insertion depth: 5 cm Catheter type: closed end flexible Catheter size: 19 Gauge Catheter at skin depth: 10 cm Test dose: negative and Other  Assessment Events: blood not aspirated, no cerebrospinal fluid, injection not painful, no injection resistance, no paresthesia and negative IV test  Additional Notes Patient identified. Risks and benefits discussed including failed block, incomplete  Pain control, post dural puncture headache, nerve damage, paralysis, blood pressure Changes, nausea, vomiting, reactions to medications-both toxic and allergic and post Partum back pain. All questions were answered. Patient expressed understanding and wished to proceed. Sterile technique was used throughout procedure. Epidural site was Dressed with sterile barrier dressing. No paresthesias, signs of intravascular injection Or signs of intrathecal spread were encountered.  Patient was more comfortable after the epidural was dosed. Please see RN's note for documentation of vital signs and FHR which are stable.  Reason for block:procedure for pain

## 2023-06-20 NOTE — Discharge Summary (Signed)
 Postpartum Discharge Summary      Patient Name: Christine Hardy DOB: Sep 27, 1990 MRN: [id number]  Date of admission: 06/19/2023 Delivery date:06/20/2023 Delivering provider: CHUBB, CASEY C Date of discharge: 06/22/2023  Admitting diagnosis: Normal labor and delivery [O80] Intrauterine pregnancy: [redacted]w[redacted]d     Secondary diagnosis:  Principal Problem:   Normal labor and delivery Active Problems:   Preeclampsia  Additional problems: Chorio, Successful VBAC     Discharge diagnosis: Term Pregnancy Delivered                                              Post partum procedures: none Augmentation: Pitocin , IP Foley, and Amioninfusion Complications: Chorioamnionitis   Hospital course: Induction of Labor With Vaginal Delivery   33 y.o. yo G2P2002 at [redacted]w[redacted]d was admitted to the hospital 06/19/2023 for induction of labor.  Indication for induction: Preeclampsia.  Patient had an labor course complicated by chorioamnionitis.  Membrane Rupture Time/Date: 2:05 AM,06/20/2023  Delivery Method:VBAC, Spontaneous Operative Delivery:N/A Episiotomy:   Lacerations:  1st degree;Perineal Details of delivery can be found in separate delivery note.  Patient had a postpartum course complicated by nothing. Asymptomatic and BPs controlled on lasix /procardia . Chorioamnionitis appears resolved. Patient is discharged home 06/22/23.  Newborn Data: Birth date:06/20/2023 Birth time:10:08 PM Gender:Female Living status:Living Apgars:7 ,9  Weight:2940 g  Magnesium Sulfate received: No BMZ received: No Rhophylac:N/A MMR:N/A T-DaP:declined Flu: declined  Transfusion:No  Immunizations received: There is no immunization history for the selected administration types on file for this patient.  Physical exam  Vitals:   06/21/23 1406 06/21/23 2113 06/22/23 0505 06/22/23 1020  BP: 122/80 110/80 115/80 112/80  Pulse: 84 79 80 86  Resp: 17 16 16    Temp: 97.7 F (36.5 C) 98 F (36.7 C) 97.6 F (36.4 C)   TempSrc: Oral Oral  Oral   SpO2: 99%  99%   Weight:      Height:       General: alert, cooperative, and no distress Lochia: appropriate Uterine Fundus: firm Incision: N/A DVT Evaluation: No cords or calf tenderness. Labs: Lab Results  Component Value Date   WBC 23.7 (H) 06/20/2023   HGB 10.9 (L) 06/20/2023   HCT 33.6 (L) 06/20/2023   MCV 77.2 (L) 06/20/2023   PLT 259 06/20/2023      Latest Ref Rng & Units 06/19/2023    8:19 PM  CMP  Glucose 70 - 99 mg/dL 75   BUN 6 - 20 mg/dL 6   Creatinine 9.55 - 8.99 mg/dL 9.54   Sodium 864 - 854 mmol/L 138   Potassium 3.5 - 5.1 mmol/L 3.9   Chloride 98 - 111 mmol/L 107   CO2 22 - 32 mmol/L 21   Calcium 8.9 - 10.3 mg/dL 9.2   Total Protein 6.5 - 8.1 g/dL 7.2   Total Bilirubin 0.0 - 1.2 mg/dL 0.5   Alkaline Phos 38 - 126 U/L 116   AST 15 - 41 U/L 20   ALT 0 - 44 U/L 13    Edinburgh Score:    06/21/2023   12:29 AM  Edinburgh Postnatal Depression Scale Screening Tool  I have been able to laugh and see the funny side of things. 0  I have looked forward with enjoyment to things. 0  I have blamed myself unnecessarily when things went wrong. 0  I have been anxious or worried for  no good reason. 0  I have felt scared or panicky for no good reason. 0  Things have been getting on top of me. 0  I have been so unhappy that I have had difficulty sleeping. 0  I have felt sad or miserable. 0  I have been so unhappy that I have been crying. 0  The thought of harming myself has occurred to me. 0  Edinburgh Postnatal Depression Scale Total 0   Edinburgh Postnatal Depression Scale Total: 0   After visit meds:  Allergies as of 06/22/2023   No Known Allergies      Medication List     TAKE these medications    acetaminophen  500 MG tablet Commonly known as: TYLENOL  Take 2 tablets (1,000 mg total) by mouth every 6 (six) hours as needed for fever.   acetaminophen  325 MG tablet Commonly known as: Tylenol  Take 2 tablets (650 mg total) by mouth every 4 (four)  hours as needed (for pain scale < 4).   furosemide  20 MG tablet Commonly known as: LASIX  Take 1 tablet (20 mg total) by mouth daily. Start taking on: June 23, 2023   NIFEdipine  30 MG 24 hr tablet Commonly known as: ADALAT  CC Take 1 tablet (30 mg total) by mouth daily. Start taking on: June 23, 2023   Prenatal Plus Vitamin/Mineral 27-1 MG Tabs Take 1 tablet by mouth daily.         Discharge home in stable condition Infant Feeding: Breast Infant Disposition:rooming in Discharge instruction: per After Visit Summary and Postpartum booklet. Activity: Advance as tolerated. Pelvic rest for 6 weeks.  Diet: routine diet Future Appointments: Future Appointments  Date Time Provider Department Center  06/30/2023  9:20 AM [facility] NURSE [facility]  08/03/2023  8:35 AM Delores Nidia CROME, FNP [facility]   Follow up Visit:  Follow-up Information     Your OB/GYN Follow up.   Why: within 1 week for blood pressure check and again in 4-6 weeks for a routine postpartum visit               Sent message to [facility] 06/20/23  Please schedule this patient for a In person postpartum visit in 4 weeks with the following provider: Any provider. Additional Postpartum F/U:BP check 1 week  Low risk pregnancy complicated by: HTN Delivery mode:  VBAC, Spontaneous Anticipated Birth Control:  undecided   06/22/2023 Devaughn KATHEE Ban, MD

## 2023-06-20 NOTE — Anesthesia Preprocedure Evaluation (Signed)
 Anesthesia Evaluation  Patient identified by MRN, date of birth, ID band Patient awake    Reviewed: Allergy & Precautions, Patient's Chart, lab work & pertinent test results  Airway Mallampati: II       Dental no notable dental hx.    Pulmonary neg pulmonary ROS   Pulmonary exam normal        Cardiovascular hypertension, Normal cardiovascular exam Rhythm:Regular Rate:Normal     Neuro/Psych negative neurological ROS  negative psych ROS   GI/Hepatic negative GI ROS, Neg liver ROS,,,  Endo/Other  Obesity  Renal/GU negative Renal ROS  negative genitourinary   Musculoskeletal negative musculoskeletal ROS (+)    Abdominal  (+) + obese  Peds  Hematology negative hematology ROS (+)   Anesthesia Other Findings   Reproductive/Obstetrics (+) Pregnancy Hx/o Previous C/Section for OP presentation 37 3/[redacted] weeks Gestational HTN                               Anesthesia Physical Anesthesia Plan  ASA: 2  Anesthesia Plan: Epidural   Post-op Pain Management: Minimal or no pain anticipated   Induction: Intravenous  PONV Risk Score and Plan:   Airway Management Planned: Natural Airway  Additional Equipment:   Intra-op Plan:   Post-operative Plan:   Informed Consent: I have reviewed the patients History and Physical, chart, labs and discussed the procedure including the risks, benefits and alternatives for the proposed anesthesia with the patient or authorized representative who has indicated his/her understanding and acceptance.       Plan Discussed with: Anesthesiologist  Anesthesia Plan Comments:          Anesthesia Quick Evaluation

## 2023-06-20 NOTE — Progress Notes (Signed)
 Patient Vitals for the past 4 hrs:  BP Pulse SpO2  06/20/23 0416 93/72 -- --  06/20/23 0412 93/71 (!) 172 --  06/20/23 0406 134/79 67 99 %  06/20/23 0404 123/71 74 --  06/20/23 0401 129/80 68 98 %  06/20/23 0359 123/73 76 --  06/20/23 0356 125/70 72 99 %  06/20/23 0351 126/82 91 98 %  06/20/23 0347 134/81 80 --  06/20/23 0346 -- -- 99 %  06/20/23 0341 (!) 142/77 74 99 %  06/20/23 0335 130/73 80 99 %  06/20/23 0330 118/70 (!) 101 100 %  06/20/23 0326 (!) 137/96 87 99 %  06/20/23 0320 -- -- 100 %  06/20/23 0316 -- -- 99 %   Shortly after epidural, FHR w/several prolonged decelerations.  Pit turned off, IVF bolus, phenylepherine and position changes. IUPC placed. Cx 3/80/-2.  Now FHR 125, moderate variability, + accels, occ very mild variable.  Will restart pitocin  if labor inadequate in an hour.

## 2023-06-21 ENCOUNTER — Encounter (HOSPITAL_COMMUNITY): Payer: Self-pay | Admitting: Family Medicine

## 2023-06-21 MED ORDER — TETANUS-DIPHTH-ACELL PERTUSSIS 5-2.5-18.5 LF-MCG/0.5 IM SUSY
0.5000 mL | PREFILLED_SYRINGE | Freq: Once | INTRAMUSCULAR | Status: DC
Start: 2023-06-22 — End: 2023-06-22

## 2023-06-21 MED ORDER — IBUPROFEN 600 MG PO TABS
600.0000 mg | ORAL_TABLET | Freq: Four times a day (QID) | ORAL | Status: DC
  Administered 2023-06-21 – 2023-06-22 (×7): 600 mg via ORAL
  Filled 2023-06-21 (×7): qty 1

## 2023-06-21 MED ORDER — BENZOCAINE-MENTHOL 20-0.5 % EX AERO
1.0000 | INHALATION_SPRAY | CUTANEOUS | Status: DC | PRN
Start: 2023-06-21 — End: 2023-06-22

## 2023-06-21 MED ORDER — ACETAMINOPHEN 325 MG PO TABS
650.0000 mg | ORAL_TABLET | ORAL | Status: DC | PRN
  Administered 2023-06-22: 650 mg via ORAL
  Filled 2023-06-21: qty 2

## 2023-06-21 MED ORDER — COCONUT OIL OIL
1.0000 | TOPICAL_OIL | Status: DC | PRN
Start: 2023-06-21 — End: 2023-06-22

## 2023-06-21 MED ORDER — SIMETHICONE 80 MG PO CHEW
80.0000 mg | CHEWABLE_TABLET | ORAL | Status: DC | PRN
  Filled 2023-06-21: qty 1

## 2023-06-21 MED ORDER — WITCH HAZEL-GLYCERIN EX PADS
1.0000 | MEDICATED_PAD | CUTANEOUS | Status: DC | PRN
Start: 2023-06-21 — End: 2023-06-22

## 2023-06-21 MED ORDER — SENNOSIDES-DOCUSATE SODIUM 8.6-50 MG PO TABS
2.0000 | ORAL_TABLET | ORAL | Status: DC
  Administered 2023-06-21 – 2023-06-22 (×2): 2 via ORAL
  Filled 2023-06-21 (×2): qty 2

## 2023-06-21 MED ORDER — OXYCODONE HCL 5 MG PO TABS: 10.0000 mg | ORAL_TABLET | ORAL | Status: DC | PRN

## 2023-06-21 MED ORDER — ONDANSETRON HCL 4 MG/2ML IJ SOLN: 4.0000 mg | INTRAMUSCULAR | Status: DC | PRN

## 2023-06-21 MED ORDER — DIBUCAINE (PERIANAL) 1 % EX OINT: 1.0000 | TOPICAL_OINTMENT | CUTANEOUS | Status: DC | PRN

## 2023-06-21 MED ORDER — PRENATAL MULTIVITAMIN CH
1.0000 | ORAL_TABLET | Freq: Every day | ORAL | Status: DC
  Administered 2023-06-21 – 2023-06-22 (×2): 1 via ORAL
  Filled 2023-06-21 (×2): qty 1

## 2023-06-21 MED ORDER — ONDANSETRON HCL 4 MG PO TABS: 4.0000 mg | ORAL_TABLET | ORAL | Status: DC | PRN

## 2023-06-21 MED ORDER — OXYCODONE HCL 5 MG PO TABS: 5.0000 mg | ORAL_TABLET | ORAL | Status: DC | PRN

## 2023-06-21 MED ORDER — NIFEDIPINE ER OSMOTIC RELEASE 30 MG PO TB24
30.0000 mg | ORAL_TABLET | Freq: Every day | ORAL | Status: DC
  Administered 2023-06-21 – 2023-06-22 (×2): 30 mg via ORAL
  Filled 2023-06-21 (×2): qty 1

## 2023-06-21 MED ORDER — FUROSEMIDE 20 MG PO TABS
20.0000 mg | ORAL_TABLET | Freq: Every day | ORAL | Status: DC
  Administered 2023-06-21 – 2023-06-22 (×2): 20 mg via ORAL
  Filled 2023-06-21 (×2): qty 1

## 2023-06-21 MED ORDER — DIPHENHYDRAMINE HCL 25 MG PO CAPS: 25.0000 mg | ORAL_CAPSULE | Freq: Four times a day (QID) | ORAL | Status: DC | PRN

## 2023-06-21 NOTE — Progress Notes (Signed)
 POSTPARTUM PROGRESS NOTE  Post Partum Day #1  Subjective:  Christine Hardy is a 33 y.o. [id number] s/p VBAC at [redacted]w[redacted]d.  She reports she is doing well. No acute events overnight. She denies any problems with ambulating, voiding or po intake. Denies nausea or vomiting.  Pain is well controlled.  Lochia is less than a period.  Objective: Blood pressure 104/69, pulse 76, temperature (!) 97.5 F (36.4 C), resp. rate 16, height 4' 9 (1.448 m), weight 71.3 kg, last menstrual period 08/23/2022, SpO2 99%, unknown if currently breastfeeding.  Physical Exam:  General: alert, cooperative and no distress Chest: no respiratory distress Heart:regular rate, distal pulses intact Abdomen: soft, nontender,  Uterine Fundus: firm, appropriately tender DVT Evaluation: No calf swelling or tenderness Extremities: mild LE edema Skin: warm, dry  Recent Labs    06/19/23 2019 06/20/23 2320  HGB 12.1 10.9*  HCT 37.6 33.6*    Assessment/Plan: Christine Hardy is a 33 y.o. [id number] s/p VBAC at [redacted]w[redacted]d   PPD#1 - Doing well  Routine postpartum care Mild pre-eclampsia: BP well controlled on Nifed 30 XL, lasix  Contraception: TBD Feeding: both Dispo: Plan for discharge PPD#2.   LOS: 2 days    Donnice CHRISTELLA Carolus, MD/MPH Attending Family Medicine Physician, [facility] for [facility], [facility]  06/21/2023, 12:48 PM

## 2023-06-21 NOTE — Anesthesia Postprocedure Evaluation (Signed)
 Anesthesia Post Note  Patient: Christine Hardy  Procedure(s) Performed: AN AD HOC LABOR EPIDURAL     Patient location during evaluation: Mother Baby Anesthesia Type: Epidural Level of consciousness: awake and alert and oriented Pain management: satisfactory to patient Vital Signs Assessment: post-procedure vital signs reviewed and stable Respiratory status: respiratory function stable Cardiovascular status: stable Postop Assessment: no headache, no backache, epidural receding, patient able to bend at knees, no signs of nausea or vomiting, adequate PO intake and able to ambulate Anesthetic complications: no   No notable events documented.  Last Vitals:  Vitals:   06/21/23 0211 06/21/23 0710  BP: 110/73 104/69  Pulse: 88 76  Resp: 18 16  Temp: 37.1 C (!) 36.4 C  SpO2: 98% 99%    Last Pain:  Vitals:   06/21/23 0710  TempSrc:   PainSc: 4    Pain Goal:                   Pauline Pegues

## 2023-06-21 NOTE — Lactation Note (Addendum)
 This note was copied from a baby's chart. Lactation Consultation Note  Patient Name: Girl Dorathea Faerber Unijb'd Date: 06/21/2023 Age:35 hours Reason for consult: Initial assessment;Early term 37-38.6wks  P2, 37 wks, @ 12 hrs of life. Video interpreter used- June # [phone]- Mom understands a lot of the English spoken to her- Encouraged mom in first day baby very sleepy, second day more awake/ feeding cues, with cluster feeding overnights. Highlighted small belly size/ small energy is why colostrum is small, baby signals mom ready for more with cluster feeding/brings in milk. Discussed holds, starting with hand expression, and holding light compression on breast to keep baby working at breast. Encouraged mom in feeding baby every 3 hrs, last had formula in the 8 o'clock hour. Encouraged mom to call with next feeding- between 11-12 or with feeding cues. Encouraged mom couplet just needs practice together, both sides are learning. Mom receptive.  Maternal Data Has patient been taught Hand Expression?: Yes  Feeding Mother's Current Feeding Choice: Breast Milk and Formula   Interventions Interventions: Breast feeding basics reviewed;Education (Lactation Education Resources handouts on colostrum and hand expression shared with mom)  Discharge Pump:  (Per mom NO pump @ home. Medicaid [employer] [place]- not able to request stork pump)  Consult Status Consult Status: Follow-up Date: 06/21/23 Follow-up type: In-patient    [facility] 06/21/2023, 10:22 AM

## 2023-06-22 ENCOUNTER — Other Ambulatory Visit (HOSPITAL_COMMUNITY): Payer: Self-pay

## 2023-06-22 MED ORDER — NIFEDIPINE ER 30 MG PO TB24
30.0000 mg | ORAL_TABLET | Freq: Every day | ORAL | 1 refills | Status: DC
  Filled 2023-06-22: qty 30, 30d supply, fill #0

## 2023-06-22 MED ORDER — ACETAMINOPHEN 325 MG PO TABS
650.0000 mg | ORAL_TABLET | ORAL | 1 refills | Status: AC | PRN
  Filled 2023-06-22: qty 60, 5d supply, fill #0

## 2023-06-22 MED ORDER — ACETAMINOPHEN 500 MG PO TABS
1000.0000 mg | ORAL_TABLET | Freq: Four times a day (QID) | ORAL | 0 refills | Status: AC | PRN
  Filled 2023-06-22: qty 30, 4d supply, fill #0

## 2023-06-22 MED ORDER — FUROSEMIDE 20 MG PO TABS
20.0000 mg | ORAL_TABLET | Freq: Every day | ORAL | 0 refills | Status: DC
  Filled 2023-06-22: qty 3, 3d supply, fill #0

## 2023-06-26 ENCOUNTER — Encounter: Payer: Medicaid Other | Admitting: Obstetrics and Gynecology

## 2023-06-30 ENCOUNTER — Ambulatory Visit: Payer: Medicaid Other

## 2023-06-30 ENCOUNTER — Other Ambulatory Visit: Payer: Self-pay

## 2023-06-30 VITALS — BP 121/95 | HR 84 | Ht 61.0 in | Wt 139.0 lb

## 2023-06-30 DIAGNOSIS — Z0130 Encounter for examination of blood pressure without abnormal findings: Secondary | ICD-10-CM

## 2023-06-30 NOTE — Progress Notes (Signed)
Blood Pressure Check Visit  Christine Hardy is here for blood pressure check following spontaneous vaginal birth on 06/22/2023. BP today is 135/91 and recheck BP was 121/95. Patient reports that she did not take her Procardia 30mg  this morning and last took it yesterday at noon. Patient denies any visual disturbances, headaches and/or edema. Per physician, continue to take her Procardia 30mg  daily and will recheck BP at [facility] appointment. Advised patient to go to the MAU if she experiences any s/s such as visual disturbances, headaches, and/or edema. Patient agreed to plan and verbalized understanding. Patient also reports that she has a BP cuff at home. Advised patient how to take accurate BP at home by sitting in a chair for at least 5 minutes with legs uncrossed in a quiet environment and to contact our office or report to MAU if BP is > 140/90.   Patient has PP appointment scheduled on 2/20 at 8:35 AM. Patient confirmed schedule appointment.   Quintella Reichert, RN 06/30/2023  9:34 AM

## 2023-08-03 ENCOUNTER — Ambulatory Visit: Payer: Medicaid Other | Admitting: Obstetrics and Gynecology

## 2023-08-10 ENCOUNTER — Encounter: Payer: Self-pay | Admitting: Family Medicine

## 2023-08-10 ENCOUNTER — Other Ambulatory Visit: Payer: Self-pay

## 2023-08-10 ENCOUNTER — Ambulatory Visit: Payer: Medicaid Other | Admitting: Family Medicine

## 2023-08-10 DIAGNOSIS — Z30017 Encounter for initial prescription of implantable subdermal contraceptive: Secondary | ICD-10-CM | POA: Diagnosis not present

## 2023-08-10 DIAGNOSIS — Z3202 Encounter for pregnancy test, result negative: Secondary | ICD-10-CM | POA: Diagnosis not present

## 2023-08-10 DIAGNOSIS — O1490 Unspecified pre-eclampsia, unspecified trimester: Secondary | ICD-10-CM

## 2023-08-10 LAB — POCT PREGNANCY, URINE: Preg Test, Ur: NEGATIVE

## 2023-08-10 MED ORDER — ETONOGESTREL 68 MG ~~LOC~~ IMPL
68.0000 mg | DRUG_IMPLANT | Freq: Once | SUBCUTANEOUS | Status: AC
  Administered 2023-08-10: 68 mg via SUBCUTANEOUS

## 2023-08-10 MED ORDER — NIFEDIPINE ER 30 MG PO TB24: 30.0000 mg | ORAL_TABLET | Freq: Every day | ORAL | 1 refills | Status: AC

## 2023-08-10 NOTE — Addendum Note (Signed)
 Addended by: Brien Mates T on: 08/10/2023 11:21 AM   Modules accepted: Orders

## 2023-08-10 NOTE — Progress Notes (Signed)
 Post Partum Visit Note  Christine Hardy is a 33 y.o. G66P2002 female who presents for a postpartum visit. She is 7 weeks 2 days postpartum following a normal spontaneous vaginal delivery.  I have fully reviewed the prenatal and intrapartum course. The delivery was at [redacted]w[redacted]d.  Anesthesia: epidural. Postpartum course has been normal. Baby is doing well. Baby is feeding by bottle - Similac . Bleeding no bleeding. Bowel function is normal. Bladder function is normal. Patient is not sexually active. Contraception method is Nexplanon. Postpartum depression screening: negative.   Upstream - 08/10/23 1013       Pregnancy Intention Screening   Does the patient want to become pregnant in the next year? No    Does the patient's partner want to become pregnant in the next year? No    Would the patient like to discuss contraceptive options today? No      Contraception Wrap Up   Current Method Abstinence    End Method Hormonal Implant            The pregnancy intention screening data noted above was reviewed. Potential methods of contraception were discussed. The patient elected to proceed with Hormonal Implant.   Edinburgh Postnatal Depression Scale - 08/10/23 1014       Edinburgh Postnatal Depression Scale:  In the Past 7 Days   I have been able to laugh and see the funny side of things. 0    I have looked forward with enjoyment to things. 0    I have blamed myself unnecessarily when things went wrong. 0    I have been anxious or worried for no good reason. 0    I have felt scared or panicky for no good reason. 0    Things have been getting on top of me. 0    I have been so unhappy that I have had difficulty sleeping. 1    I have felt sad or miserable. 0    I have been so unhappy that I have been crying. 1    The thought of harming myself has occurred to me. 0    Edinburgh Postnatal Depression Scale Total 2             Health Maintenance Due  Topic Date Due   COVID-19 Vaccine (1 -  2024-25 season) Never done    The following portions of the patient's history were reviewed and updated as appropriate: allergies, current medications, past family history, past medical history, past social history, past surgical history, and problem list.  Review of Systems Pertinent items noted in HPI and remainder of comprehensive ROS otherwise negative.  Objective:  BP (!) 138/100   Pulse 85   LMP 08/23/2022 (Within Weeks)   Breastfeeding No    General:  alert, cooperative, and appears stated age   Breasts:  not indicated  Lungs: Normal effort  Heart:  regular rate and rhythm  Abdomen: soft, non-tender; bowel sounds normal; no masses,  no organomegaly   GU exam:  not indicated   Procedure: Patient given informed consent, signed copy in the chart, time out was performed. Pregnancy test was negative. Appropriate time out taken.  Patient's left arm was prepped and draped in the usual sterile fashion.. The ruler used to measure and mark insertion area.  Pt was prepped with alcohol swab and then injected with 3 cc of 1% lidocaine with epinephrine.  Pt was prepped with betadine, Nexplanon removed form packaging,  Device confirmed in needle, then inserted  full length of needle and withdrawn per handbook instructions.  Pt insertion site covered with steri strips and pressure dressing.   Minimal blood loss.  Pt tolerated the procedure well.    Assessment:   Normal postpartum exam.   Plan:   Essential components of care per ACOG recommendations:  1.  Mood and well being: Patient with negative depression screening today. Reviewed local resources for support.  - Patient tobacco use? No.   - hx of drug use? No.    2. Infant care and feeding:  -Patient currently breastmilk feeding? No.  -Social determinants of health (SDOH) reviewed in EPIC. No concerns  3. Sexuality, contraception and birth spacing - Patient does not want a pregnancy in the next year.  Desired family size is unsure  number children.  - Reviewed reproductive life planning. Reviewed contraceptive methods based on pt preferences and effectiveness.  Patient desired Hormonal Implant today.   - Discussed birth spacing of 18 months  4. Sleep and fatigue -Encouraged family/partner/community support of 4 hrs of uninterrupted sleep to help with mood and fatigue  5. Physical Recovery  - Discussed patients delivery and complications. She describes her labor as mixed. - Patient had a Vaginal, no problems at delivery. Patient had a 1st degree laceration. Perineal healing reviewed. Patient expressed understanding - Patient has urinary incontinence? No. - Patient is safe to resume physical and sexual activity  6.  Health Maintenance - HM due items addressed Yes - Last pap smear  Diagnosis  Date Value Ref Range Status  02/17/2023   Final   - Negative for intraepithelial lesion or malignancy (NILM)   Pap smear not done at today's visit.  -Breast Cancer screening indicated? No.   7. Chronic Disease/Pregnancy Condition follow up: Hypertension  - PCP follow up  Reva Bores, MD Center for [facility], [facility]

## 2024-05-06 ENCOUNTER — Telehealth (HOSPITAL_COMMUNITY): Payer: Self-pay | Admitting: Family Medicine

## 2024-05-06 ENCOUNTER — Ambulatory Visit (HOSPITAL_COMMUNITY)
Admission: EM | Admit: 2024-05-06 | Discharge: 2024-05-06 | Disposition: A | Discharge status: Home / Self Care | Attending: Family Medicine | Admitting: Family Medicine

## 2024-05-06 ENCOUNTER — Encounter (HOSPITAL_COMMUNITY): Payer: Self-pay

## 2024-05-06 DIAGNOSIS — R1013 Epigastric pain: Secondary | ICD-10-CM

## 2024-05-06 DIAGNOSIS — N926 Irregular menstruation, unspecified: Secondary | ICD-10-CM

## 2024-05-06 LAB — POCT URINE DIPSTICK
Glucose, UA: NEGATIVE mg/dL
Leukocytes, UA: NEGATIVE
Nitrite, UA: NEGATIVE
Protein Ur, POC: NEGATIVE mg/dL
Spec Grav, UA: 1.015 (ref 1.010–1.025)
Urobilinogen, UA: 2 U/dL — AB
pH, UA: 6.5 (ref 5.0–8.0)

## 2024-05-06 LAB — COMPREHENSIVE METABOLIC PANEL WITH GFR
ALT: 498 U/L — ABNORMAL HIGH (ref 0–44)
AST: 185 U/L — ABNORMAL HIGH (ref 15–41)
Albumin: 3.6 g/dL (ref 3.5–5.0)
Alkaline Phosphatase: 357 U/L — ABNORMAL HIGH (ref 38–126)
Anion gap: 13 (ref 5–15)
BUN: 5 mg/dL — ABNORMAL LOW (ref 6–20)
CO2: 25 mmol/L (ref 22–32)
Calcium: 9.1 mg/dL (ref 8.9–10.3)
Chloride: 101 mmol/L (ref 98–111)
Creatinine, Ser: 0.62 mg/dL (ref 0.44–1.00)
GFR, Estimated: >60 mL/min (ref >60)
Glucose, Bld: 178 mg/dL — ABNORMAL HIGH (ref 70–99)
Potassium: 3.1 mmol/L — ABNORMAL LOW (ref 3.5–5.1)
Sodium: 139 mmol/L (ref 135–145)
Total Bilirubin: 4.8 mg/dL — ABNORMAL HIGH (ref 0.0–1.2)
Total Protein: 8.1 g/dL (ref 6.5–8.1)

## 2024-05-06 LAB — CBC WITH DIFFERENTIAL/PLATELET
Abs Immature Granulocytes: 0.04 K/uL (ref 0.00–0.07)
Basophils Absolute: 0 K/uL (ref 0.0–0.1)
Basophils Relative: 0 %
Eosinophils Absolute: 0.1 K/uL (ref 0.0–0.5)
Eosinophils Relative: 1 %
HCT: 38.6 % (ref 36.0–46.0)
Hemoglobin: 12.5 g/dL (ref 12.0–15.0)
Immature Granulocytes: 0 %
Lymphocytes Relative: 14 %
Lymphs Abs: 1.3 K/uL (ref 0.7–4.0)
MCH: 26.2 pg (ref 26.0–34.0)
MCHC: 32.4 g/dL (ref 30.0–36.0)
MCV: 80.9 fL (ref 80.0–100.0)
Monocytes Absolute: 0.4 K/uL (ref 0.1–1.0)
Monocytes Relative: 4 %
Neutro Abs: 7.3 K/uL (ref 1.7–7.7)
Neutrophils Relative %: 81 %
Platelets: 374 K/uL (ref 150–400)
RBC: 4.77 MIL/uL (ref 3.87–5.11)
RDW: 13.4 % (ref 11.5–15.5)
WBC: 9.1 K/uL (ref 4.0–10.5)
nRBC: 0 % (ref 0.0–0.2)

## 2024-05-06 LAB — HEPATITIS PANEL, ACUTE
HCV Ab: NONREACTIVE
Hep A IgM: NONREACTIVE
Hep B C IgM: NONREACTIVE
Hepatitis B Surface Ag: NONREACTIVE

## 2024-05-06 LAB — POCT URINE PREGNANCY: Preg Test, Ur: NEGATIVE

## 2024-05-06 LAB — LIPASE, BLOOD: Lipase: 41 U/L (ref 11–51)

## 2024-05-06 NOTE — Telephone Encounter (Signed)
 I called pt's phone and brother's phone to discuss abnormal lab results, including elevated liver enzymes that show possible cholestasis and gallstones.  Her potassium is also low.  There was no answer on either phone.  I left a voicemail on the patient's voicemail saying just that I had tried to call her.  I did try again and was able to speak with the patient by phone.  After confirming identity I gave her results and asked her to proceed to the emergency room for further evaluation.  She is agreeable and will do so.

## 2024-05-06 NOTE — ED Triage Notes (Signed)
 Pt c/o upper center abdominal burning and tightness x2wks. States vomiting after eating Friday and Saturday night. States been taking Pepcid, Prilosec, and tylenol  with little relief. States notices her eyes are yellow yesterday.

## 2024-05-06 NOTE — Discharge Instructions (Addendum)
 We have drawn blood to check your blood counts, your liver and kidney function numbers, and tests for hepatitis.  Staff will notify you if there is anything significantly abnormal  Please go to the emergency room if you worsen in any way, such as your pain intensifies or you begin to vomit and cannot stop, or if you not improving.  You may end up needing to go to the emergency room at any rate for further testing since you do not have a primarycare provider at this time

## 2024-05-06 NOTE — ED Provider Notes (Addendum)
 [facility]    CSN: [id number] Arrival date & time: 05/06/24  1130      History   Chief Complaint Chief Complaint  Patient presents with   Abdominal Pain    HPI Christine Hardy is a 33 y.o. female.    Abdominal Pain  Here for upper abdominal pain and vomiting and now yellow color.  For about 2 weeks she has had some upper abdominal pain that is sometimes a burning and sometimes a tightness.  On the evening of November 21 she experienced nausea and vomiting, and she vomited twice.  She states she is no longer nauseated.  She has been trying Pepcid, Prilosec, and/or Tylenol .  The Tylenol  helps some of the pains and the Pepcid helps some of the burning.  No fever or chills and no dysuria.  Last menstrual cycle was about 2 months ago.  She does have a Nexplanon .  She did have a baby in January of this year.  Testing while she was pregnant showed that she did not have chronic hepatitis B and she was negative for antibodies to hepatitis C.  NKDA  She last had a bowel movement this morning.  She is not having any diarrhea and has not noted abnormal color to her stools.  She does note that she is having less bowel movements since this started bothering her, though she is eating much less.  She states that eating any food causes her to have more pain.  She has not had any dizziness. Past Medical History:  Diagnosis Date   Medical history non-contributory     Patient Active Problem List   Diagnosis Date Noted   Preeclampsia 06/19/2023   Language barrier affecting health care 05/30/2023   Asymptomatic bacteriuria during pregnancy 02/19/2023   History of C-section 02/14/2023    Past Surgical History:  Procedure Laterality Date   CESAREAN SECTION      OB History     Gravida  2   Para  2   Term  2   Preterm  0   AB  0   Living  2      SAB  0   IAB  0   Ectopic  0   Multiple  0   Live Births  2            Home Medications    Prior to  Admission medications   Medication Sig Start Date End Date Taking? Authorizing Provider  acetaminophen  (TYLENOL ) 325 MG tablet Take 2 tablets (650 mg total) by mouth every 4 (four) hours as needed (for pain scale < 4). Patient not taking: Reported on 08/10/2023 06/22/23   Kandis Devaughn Sayres, MD  acetaminophen  (TYLENOL ) 500 MG tablet Take 2 tablets (1,000 mg total) by mouth every 6 (six) hours as needed for fever. Patient not taking: Reported on 08/10/2023 06/22/23   Kandis Devaughn Sayres, MD  NIFEdipine  (ADALAT  CC) 30 MG 24 hr tablet Take 1 tablet (30 mg total) by mouth daily. Patient not taking: Reported on 05/06/2024 08/10/23   Fredirick Glenys RAMAN, MD    Family History Family History  Problem Relation Age of Onset   Diabetes Mother    Hypertension Mother    Diabetes Father     Social History Social History   Tobacco Use   Smoking status: Never   Smokeless tobacco: Never  Vaping Use   Vaping status: Never Used  Substance Use Topics   Alcohol use: Never   Drug use: Never  Allergies   Patient has no known allergies.   Review of Systems Review of Systems  Gastrointestinal:  Positive for abdominal pain.     Physical Exam Triage Vital Signs ED Triage Vitals [05/06/24 1237]  Encounter Vitals Group     BP 122/75     Girls Systolic BP Percentile      Girls Diastolic BP Percentile      Boys Systolic BP Percentile      Boys Diastolic BP Percentile      Pulse Rate (!) 117     Resp 16     Temp 98.3 F (36.8 C)     Temp Source Oral     SpO2 96 %     Weight      Height      Head Circumference      Peak Flow      Pain Score      Pain Loc      Pain Education      Exclude from Growth Chart    No data found.  Updated Vital Signs BP 122/75 (BP Location: Left Arm)   Pulse (!) 117   Temp 98.3 F (36.8 C) (Oral)   Resp 16   LMP  (Exact Date)   SpO2 96%   Breastfeeding No   Visual Acuity Right Eye Distance:   Left Eye Distance:   Bilateral Distance:    Right Eye  Near:   Left Eye Near:    Bilateral Near:     Physical Exam Vitals reviewed.  Constitutional:      General: She is not in acute distress.    Appearance: She is not ill-appearing, toxic-appearing or diaphoretic.  HENT:     Mouth/Throat:     Mouth: Mucous membranes are moist.  Eyes:     Extraocular Movements: Extraocular movements intact.     Pupils: Pupils are equal, round, and reactive to light.     Comments: Her conjunctivae are yellow  Cardiovascular:     Rate and Rhythm: Normal rate and regular rhythm.     Heart sounds: No murmur heard. Pulmonary:     Effort: Pulmonary effort is normal.     Breath sounds: Normal breath sounds.  Abdominal:     General: There is no distension.     Palpations: Abdomen is soft.     Tenderness: There is no guarding.     Comments: There is mild periumbilical tenderness.  No mass or organomegaly  Musculoskeletal:     Cervical back: Neck supple.  Lymphadenopathy:     Cervical: No cervical adenopathy.  Skin:    Coloration: Skin is jaundiced. Skin is not pale.  Neurological:     General: No focal deficit present.     Mental Status: She is alert and oriented to person, place, and time.  Psychiatric:        Behavior: Behavior normal.      UC Treatments / Results  Labs (all labs ordered are listed, but only abnormal results are displayed) Labs Reviewed  POCT URINE DIPSTICK - Abnormal; Notable for the following components:      Result Value   Color, UA orange (*)    Clarity, UA cloudy (*)    Bilirubin, UA moderate (*)    Ketones, POC UA moderate (40) (*)    Blood, UA trace-lysed (*)    Urobilinogen, UA 2.0 (*)    All other components within normal limits  POCT URINE PREGNANCY - Normal  CBC WITH DIFFERENTIAL/PLATELET  COMPREHENSIVE  METABOLIC PANEL WITH GFR  LIPASE, BLOOD  HEPATITIS PANEL, ACUTE    EKG   Radiology No results found.  Procedures Procedures (including critical care time)  Medications Ordered in UC Medications -  No data to display  Initial Impression / Assessment and Plan / UC Course  I have reviewed the triage vital signs and the nursing notes.  Pertinent labs & imaging results that were available during my care of the patient were reviewed by me and considered in my medical decision making (see chart for details).     I discussed with her that her differential diagnosis includes infectious hepatitis and gallstones.  Her pain is currently a 2 out of 10.  We discussed her going to the emergency room.  We decided to go ahead and do blood work here, and we will notify her if there are any significant abnormalities.  Urine pregnancy test is negative  Urinalysis shows bilirubin, but no white blood cells or red blood cells or nitrites   Final Clinical Impressions(s) / UC Diagnoses   Final diagnoses:  Irregular menses  Epigastric pain     Discharge Instructions      We have drawn blood to check your blood counts, your liver and kidney function numbers, and tests for hepatitis.  Staff will notify you if there is anything significantly abnormal  Please go to the emergency room if you worsen in any way, such as your pain intensifies or you begin to vomit and cannot stop, or if you not improving.  You may end up needing to go to the emergency room at any rate for further testing since you do not have a primarycare provider at this time       ED Prescriptions   None    PDMP not reviewed this encounter.   Vonna Sharlet POUR, MD 05/06/24 [id number], MD 05/06/24 [phone]

## 2024-05-07 ENCOUNTER — Ambulatory Visit (HOSPITAL_COMMUNITY): Payer: Self-pay
